# Patient Record
Sex: Female | Born: 1970
Health system: Southern US, Community
[De-identification: ages and names within clinical notes are randomized; demographics above are authoritative.]

## PROBLEM LIST (undated history)

## (undated) DIAGNOSIS — C801 Malignant (primary) neoplasm, unspecified: Secondary | ICD-10-CM

## (undated) DIAGNOSIS — T7840XA Allergy, unspecified, initial encounter: Secondary | ICD-10-CM

## (undated) DIAGNOSIS — D649 Anemia, unspecified: Secondary | ICD-10-CM

## (undated) DIAGNOSIS — R7303 Prediabetes: Secondary | ICD-10-CM

## (undated) DIAGNOSIS — C73 Malignant neoplasm of thyroid gland: Secondary | ICD-10-CM

## (undated) DIAGNOSIS — D259 Leiomyoma of uterus, unspecified: Secondary | ICD-10-CM

## (undated) DIAGNOSIS — I10 Essential (primary) hypertension: Secondary | ICD-10-CM

## (undated) HISTORY — DX: Essential (primary) hypertension: I10

## (undated) HISTORY — DX: Leiomyoma of uterus, unspecified: D25.9

## (undated) HISTORY — DX: Anemia, unspecified: D64.9

## (undated) HISTORY — DX: Allergy, unspecified, initial encounter: T78.40XA

## (undated) HISTORY — PX: TUBAL LIGATION: SHX77

## (undated) HISTORY — DX: Malignant neoplasm of thyroid gland: C73

---
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
## 1998-02-27 ENCOUNTER — Encounter: Admission: RE | Admit: 1998-02-27 | Discharge: 1998-02-27 | Payer: Self-pay | Admitting: Family Medicine

## 1998-04-03 ENCOUNTER — Other Ambulatory Visit: Admission: RE | Admit: 1998-04-03 | Discharge: 1998-04-03 | Payer: Self-pay | Admitting: *Deleted

## 1998-06-16 ENCOUNTER — Emergency Department (HOSPITAL_COMMUNITY): Admission: EM | Admit: 1998-06-16 | Discharge: 1998-06-16 | Payer: Self-pay | Admitting: Emergency Medicine

## 2000-02-20 ENCOUNTER — Encounter: Admission: RE | Admit: 2000-02-20 | Discharge: 2000-02-20 | Payer: Self-pay | Admitting: Family Medicine

## 2000-04-23 ENCOUNTER — Encounter: Admission: RE | Admit: 2000-04-23 | Discharge: 2000-04-23 | Payer: Self-pay | Admitting: Family Medicine

## 2000-06-02 ENCOUNTER — Encounter: Admission: RE | Admit: 2000-06-02 | Discharge: 2000-06-02 | Payer: Self-pay | Admitting: Family Medicine

## 2000-06-23 ENCOUNTER — Other Ambulatory Visit: Admission: RE | Admit: 2000-06-23 | Discharge: 2000-06-23 | Payer: Self-pay | Admitting: Family Medicine

## 2000-06-23 ENCOUNTER — Encounter: Admission: RE | Admit: 2000-06-23 | Discharge: 2000-06-23 | Payer: Self-pay | Admitting: Family Medicine

## 2000-07-16 ENCOUNTER — Encounter: Admission: RE | Admit: 2000-07-16 | Discharge: 2000-07-16 | Payer: Self-pay | Admitting: Family Medicine

## 2001-03-26 ENCOUNTER — Encounter: Admission: RE | Admit: 2001-03-26 | Discharge: 2001-03-26 | Payer: Self-pay | Admitting: Family Medicine

## 2001-05-25 ENCOUNTER — Encounter: Admission: RE | Admit: 2001-05-25 | Discharge: 2001-05-25 | Payer: Self-pay | Admitting: Family Medicine

## 2001-05-29 ENCOUNTER — Encounter: Admission: RE | Admit: 2001-05-29 | Discharge: 2001-05-29 | Payer: Self-pay | Admitting: Family Medicine

## 2001-07-14 ENCOUNTER — Other Ambulatory Visit: Admission: RE | Admit: 2001-07-14 | Discharge: 2001-07-14 | Payer: Self-pay | Admitting: Family Medicine

## 2001-07-14 ENCOUNTER — Encounter: Admission: RE | Admit: 2001-07-14 | Discharge: 2001-07-14 | Payer: Self-pay | Admitting: Family Medicine

## 2001-07-17 ENCOUNTER — Encounter: Admission: RE | Admit: 2001-07-17 | Discharge: 2001-07-17 | Payer: Self-pay | Admitting: Family Medicine

## 2001-08-27 ENCOUNTER — Encounter: Admission: RE | Admit: 2001-08-27 | Discharge: 2001-08-27 | Payer: Self-pay | Admitting: Family Medicine

## 2001-09-04 ENCOUNTER — Encounter: Admission: RE | Admit: 2001-09-04 | Discharge: 2001-09-04 | Payer: Self-pay | Admitting: Family Medicine

## 2001-09-21 ENCOUNTER — Encounter: Admission: RE | Admit: 2001-09-21 | Discharge: 2001-09-21 | Payer: Self-pay | Admitting: Sports Medicine

## 2002-01-12 ENCOUNTER — Encounter: Admission: RE | Admit: 2002-01-12 | Discharge: 2002-01-12 | Payer: Self-pay | Admitting: Family Medicine

## 2002-08-27 ENCOUNTER — Encounter: Admission: RE | Admit: 2002-08-27 | Discharge: 2002-08-27 | Payer: Self-pay | Admitting: Family Medicine

## 2002-08-30 ENCOUNTER — Encounter: Admission: RE | Admit: 2002-08-30 | Discharge: 2002-08-30 | Payer: Self-pay | Admitting: Family Medicine

## 2002-09-23 ENCOUNTER — Emergency Department (HOSPITAL_COMMUNITY): Admission: EM | Admit: 2002-09-23 | Discharge: 2002-09-23 | Payer: Self-pay

## 2002-10-01 ENCOUNTER — Other Ambulatory Visit: Admission: RE | Admit: 2002-10-01 | Discharge: 2002-10-01 | Payer: Self-pay | Admitting: Family Medicine

## 2002-10-01 ENCOUNTER — Encounter: Admission: RE | Admit: 2002-10-01 | Discharge: 2002-10-01 | Payer: Self-pay | Admitting: Sports Medicine

## 2003-07-17 ENCOUNTER — Other Ambulatory Visit: Admission: RE | Admit: 2003-07-17 | Discharge: 2003-07-17 | Payer: Self-pay | Admitting: Obstetrics and Gynecology

## 2003-09-15 ENCOUNTER — Encounter: Admission: RE | Admit: 2003-09-15 | Discharge: 2003-09-15 | Payer: Self-pay | Admitting: Family Medicine

## 2003-09-22 ENCOUNTER — Encounter: Admission: RE | Admit: 2003-09-22 | Discharge: 2003-09-22 | Payer: Self-pay | Admitting: Family Medicine

## 2003-09-29 ENCOUNTER — Ambulatory Visit (HOSPITAL_COMMUNITY): Admission: RE | Admit: 2003-09-29 | Discharge: 2003-09-29 | Payer: Self-pay | Admitting: Obstetrics and Gynecology

## 2004-01-07 ENCOUNTER — Inpatient Hospital Stay (HOSPITAL_COMMUNITY): Admission: AD | Admit: 2004-01-07 | Discharge: 2004-01-07 | Payer: Self-pay | Admitting: Obstetrics & Gynecology

## 2004-02-28 ENCOUNTER — Encounter: Admission: RE | Admit: 2004-02-28 | Discharge: 2004-02-28 | Payer: Self-pay | Admitting: Obstetrics and Gynecology

## 2004-05-04 ENCOUNTER — Inpatient Hospital Stay (HOSPITAL_COMMUNITY): Admission: RE | Admit: 2004-05-04 | Discharge: 2004-05-06 | Payer: Self-pay | Admitting: Obstetrics and Gynecology

## 2004-05-04 ENCOUNTER — Encounter (INDEPENDENT_AMBULATORY_CARE_PROVIDER_SITE_OTHER): Payer: Self-pay | Admitting: *Deleted

## 2004-08-22 ENCOUNTER — Other Ambulatory Visit: Admission: RE | Admit: 2004-08-22 | Discharge: 2004-08-22 | Payer: Self-pay | Admitting: Family Medicine

## 2004-08-22 ENCOUNTER — Ambulatory Visit: Payer: Self-pay | Admitting: Family Medicine

## 2004-09-21 ENCOUNTER — Ambulatory Visit: Payer: Self-pay | Admitting: Family Medicine

## 2004-10-10 ENCOUNTER — Ambulatory Visit: Payer: Self-pay | Admitting: Family Medicine

## 2004-10-17 ENCOUNTER — Ambulatory Visit: Payer: Self-pay | Admitting: Sports Medicine

## 2004-10-19 ENCOUNTER — Ambulatory Visit: Payer: Self-pay | Admitting: Family Medicine

## 2004-12-11 ENCOUNTER — Encounter: Admission: RE | Admit: 2004-12-11 | Discharge: 2004-12-11 | Payer: Self-pay | Admitting: Sports Medicine

## 2004-12-11 ENCOUNTER — Ambulatory Visit: Payer: Self-pay | Admitting: Sports Medicine

## 2005-08-09 ENCOUNTER — Ambulatory Visit: Payer: Self-pay | Admitting: Family Medicine

## 2005-09-20 ENCOUNTER — Ambulatory Visit: Payer: Self-pay | Admitting: Family Medicine

## 2005-09-20 ENCOUNTER — Other Ambulatory Visit: Admission: RE | Admit: 2005-09-20 | Discharge: 2005-09-20 | Payer: Self-pay | Admitting: Family Medicine

## 2005-09-21 ENCOUNTER — Encounter (INDEPENDENT_AMBULATORY_CARE_PROVIDER_SITE_OTHER): Payer: Self-pay | Admitting: Family Medicine

## 2005-09-21 ENCOUNTER — Encounter (INDEPENDENT_AMBULATORY_CARE_PROVIDER_SITE_OTHER): Payer: Self-pay | Admitting: *Deleted

## 2005-09-21 LAB — CONVERTED CEMR LAB: Pap Smear: NORMAL

## 2006-05-15 DIAGNOSIS — D259 Leiomyoma of uterus, unspecified: Secondary | ICD-10-CM

## 2006-05-15 DIAGNOSIS — D509 Iron deficiency anemia, unspecified: Secondary | ICD-10-CM

## 2006-05-16 ENCOUNTER — Encounter (INDEPENDENT_AMBULATORY_CARE_PROVIDER_SITE_OTHER): Payer: Self-pay | Admitting: *Deleted

## 2006-05-29 ENCOUNTER — Encounter (INDEPENDENT_AMBULATORY_CARE_PROVIDER_SITE_OTHER): Payer: Self-pay | Admitting: *Deleted

## 2006-05-29 ENCOUNTER — Inpatient Hospital Stay (HOSPITAL_COMMUNITY): Admission: RE | Admit: 2006-05-29 | Discharge: 2006-06-01 | Payer: Self-pay | Admitting: Obstetrics and Gynecology

## 2006-08-12 ENCOUNTER — Telehealth (INDEPENDENT_AMBULATORY_CARE_PROVIDER_SITE_OTHER): Payer: Self-pay | Admitting: *Deleted

## 2006-08-26 ENCOUNTER — Telehealth: Payer: Self-pay | Admitting: *Deleted

## 2006-08-27 ENCOUNTER — Ambulatory Visit: Payer: Self-pay | Admitting: Family Medicine

## 2006-08-27 ENCOUNTER — Encounter (INDEPENDENT_AMBULATORY_CARE_PROVIDER_SITE_OTHER): Payer: Self-pay | Admitting: *Deleted

## 2006-08-27 LAB — CONVERTED CEMR LAB
Albumin: 4.3 g/dL (ref 3.5–5.2)
Alkaline Phosphatase: 84 units/L (ref 39–117)
Calcium: 9.1 mg/dL (ref 8.4–10.5)
Chloride: 106 meq/L (ref 96–112)
Hemoglobin: 12.2 g/dL (ref 12.0–15.0)
MCHC: 32.1 g/dL (ref 30.0–36.0)
MCV: 87.4 fL (ref 78.0–100.0)
Platelets: 272 10*3/uL (ref 150–400)
Potassium: 4 meq/L (ref 3.5–5.3)
Total Protein: 7.3 g/dL (ref 6.0–8.3)

## 2006-09-03 ENCOUNTER — Telehealth: Payer: Self-pay | Admitting: *Deleted

## 2006-09-04 ENCOUNTER — Ambulatory Visit: Payer: Self-pay | Admitting: Sports Medicine

## 2006-09-17 ENCOUNTER — Ambulatory Visit: Payer: Self-pay | Admitting: Sports Medicine

## 2006-09-24 ENCOUNTER — Encounter: Admission: RE | Admit: 2006-09-24 | Discharge: 2006-09-24 | Payer: Self-pay | Admitting: Family Medicine

## 2006-11-10 ENCOUNTER — Telehealth (INDEPENDENT_AMBULATORY_CARE_PROVIDER_SITE_OTHER): Payer: Self-pay | Admitting: Family Medicine

## 2006-11-12 ENCOUNTER — Telehealth (INDEPENDENT_AMBULATORY_CARE_PROVIDER_SITE_OTHER): Payer: Self-pay | Admitting: Family Medicine

## 2006-11-24 ENCOUNTER — Encounter (INDEPENDENT_AMBULATORY_CARE_PROVIDER_SITE_OTHER): Payer: Self-pay | Admitting: Family Medicine

## 2007-01-07 ENCOUNTER — Telehealth (INDEPENDENT_AMBULATORY_CARE_PROVIDER_SITE_OTHER): Payer: Self-pay | Admitting: *Deleted

## 2007-01-07 ENCOUNTER — Ambulatory Visit: Payer: Self-pay | Admitting: Family Medicine

## 2007-01-07 DIAGNOSIS — I10 Essential (primary) hypertension: Secondary | ICD-10-CM | POA: Insufficient documentation

## 2007-01-07 DIAGNOSIS — L84 Corns and callosities: Secondary | ICD-10-CM

## 2007-01-23 ENCOUNTER — Telehealth (INDEPENDENT_AMBULATORY_CARE_PROVIDER_SITE_OTHER): Payer: Self-pay | Admitting: Family Medicine

## 2007-01-29 ENCOUNTER — Encounter (INDEPENDENT_AMBULATORY_CARE_PROVIDER_SITE_OTHER): Payer: Self-pay | Admitting: Family Medicine

## 2007-01-30 ENCOUNTER — Ambulatory Visit: Payer: Self-pay | Admitting: Family Medicine

## 2007-01-30 ENCOUNTER — Encounter (INDEPENDENT_AMBULATORY_CARE_PROVIDER_SITE_OTHER): Payer: Self-pay | Admitting: Family Medicine

## 2007-01-30 DIAGNOSIS — B351 Tinea unguium: Secondary | ICD-10-CM

## 2007-01-30 LAB — CONVERTED CEMR LAB
BUN: 14 mg/dL (ref 6–23)
Calcium: 9.3 mg/dL (ref 8.4–10.5)
Chloride: 105 meq/L (ref 96–112)
Cholesterol: 123 mg/dL (ref 0–200)
LDL Cholesterol: 70 mg/dL (ref 0–99)
Potassium: 3.9 meq/L (ref 3.5–5.3)
Sodium: 141 meq/L (ref 135–145)
Total CHOL/HDL Ratio: 3.2
Triglycerides: 76 mg/dL (ref <150)

## 2007-05-28 ENCOUNTER — Telehealth (INDEPENDENT_AMBULATORY_CARE_PROVIDER_SITE_OTHER): Payer: Self-pay | Admitting: Family Medicine

## 2008-04-12 ENCOUNTER — Encounter: Payer: Self-pay | Admitting: Family Medicine

## 2008-04-12 ENCOUNTER — Ambulatory Visit: Payer: Self-pay | Admitting: Family Medicine

## 2008-04-12 DIAGNOSIS — L909 Atrophic disorder of skin, unspecified: Secondary | ICD-10-CM | POA: Insufficient documentation

## 2008-04-12 DIAGNOSIS — L919 Hypertrophic disorder of the skin, unspecified: Secondary | ICD-10-CM

## 2008-04-14 ENCOUNTER — Encounter: Payer: Self-pay | Admitting: Family Medicine

## 2008-04-26 ENCOUNTER — Encounter: Payer: Self-pay | Admitting: Sports Medicine

## 2008-05-03 ENCOUNTER — Telehealth: Payer: Self-pay | Admitting: Sports Medicine

## 2008-06-10 ENCOUNTER — Ambulatory Visit: Payer: Self-pay | Admitting: Family Medicine

## 2008-06-10 ENCOUNTER — Encounter: Payer: Self-pay | Admitting: Family Medicine

## 2008-06-10 LAB — CONVERTED CEMR LAB
ALT: 21 units/L (ref 0–35)
Alkaline Phosphatase: 63 units/L (ref 39–117)
Calcium: 9.3 mg/dL (ref 8.4–10.5)
Chloride: 108 meq/L (ref 96–112)
Cholesterol: 113 mg/dL (ref 0–200)
Glucose, Bld: 102 mg/dL — ABNORMAL HIGH (ref 70–99)
HCT: 34.1 % — ABNORMAL LOW (ref 36.0–46.0)
HDL: 43 mg/dL (ref >39)
LDL Cholesterol: 62 mg/dL (ref 0–99)
Potassium: 3.9 meq/L (ref 3.5–5.3)
RBC: 4.08 M/uL (ref 3.87–5.11)
Total Bilirubin: 0.6 mg/dL (ref 0.3–1.2)
Total Protein: 7.7 g/dL (ref 6.0–8.3)
Triglycerides: 38 mg/dL (ref <150)

## 2008-08-01 ENCOUNTER — Telehealth (INDEPENDENT_AMBULATORY_CARE_PROVIDER_SITE_OTHER): Payer: Self-pay | Admitting: *Deleted

## 2008-11-10 ENCOUNTER — Ambulatory Visit: Payer: Self-pay | Admitting: Family Medicine

## 2008-11-10 ENCOUNTER — Encounter: Payer: Self-pay | Admitting: Sports Medicine

## 2008-11-10 LAB — CONVERTED CEMR LAB
Hemoglobin: 11.3 g/dL
TSH: 1.952 u[IU]/mL (ref 0.350–4.500)

## 2009-04-06 ENCOUNTER — Telehealth: Payer: Self-pay | Admitting: *Deleted

## 2009-04-06 ENCOUNTER — Encounter: Payer: Self-pay | Admitting: Family Medicine

## 2009-04-06 ENCOUNTER — Ambulatory Visit: Payer: Self-pay | Admitting: Family Medicine

## 2009-04-06 LAB — CONVERTED CEMR LAB
Bilirubin Urine: NEGATIVE
Glucose, Urine, Semiquant: NEGATIVE
Ketones, urine, test strip: NEGATIVE
Specific Gravity, Urine: 1.02
Urobilinogen, UA: 0.2
pH: 6

## 2009-04-10 ENCOUNTER — Encounter: Payer: Self-pay | Admitting: *Deleted

## 2009-04-10 LAB — CONVERTED CEMR LAB
ALT: 13 units/L (ref 0–35)
BUN: 15 mg/dL (ref 6–23)
Basophils Relative: 1 % (ref 0–1)
CO2: 26 meq/L (ref 19–32)
Calcium: 9.5 mg/dL (ref 8.4–10.5)
Creatinine, Ser: 0.79 mg/dL (ref 0.40–1.20)
Eosinophils Absolute: 0.1 10*3/uL (ref 0.0–0.7)
Glucose, Bld: 97 mg/dL (ref 70–99)
HCT: 35.3 % — ABNORMAL LOW (ref 36.0–46.0)
Hemoglobin: 11 g/dL — ABNORMAL LOW (ref 12.0–15.0)
Lymphs Abs: 2.8 10*3/uL (ref 0.7–4.0)
MCHC: 31.2 g/dL (ref 30.0–36.0)
MCV: 85.3 fL (ref 78.0–100.0)
Potassium: 3.8 meq/L (ref 3.5–5.3)
RBC: 4.14 M/uL (ref 3.87–5.11)
RDW: 15 % (ref 11.5–15.5)

## 2009-06-27 ENCOUNTER — Telehealth: Payer: Self-pay | Admitting: *Deleted

## 2009-07-11 ENCOUNTER — Ambulatory Visit: Payer: Self-pay | Admitting: Family Medicine

## 2009-07-14 ENCOUNTER — Encounter: Admission: RE | Admit: 2009-07-14 | Discharge: 2009-07-14 | Payer: Self-pay | Admitting: Sports Medicine

## 2009-07-14 LAB — CONVERTED CEMR LAB: Pap Smear: NORMAL

## 2010-01-22 ENCOUNTER — Encounter: Payer: Self-pay | Admitting: Sports Medicine

## 2010-01-24 ENCOUNTER — Telehealth: Payer: Self-pay | Admitting: Sports Medicine

## 2010-04-19 NOTE — Miscellaneous (Signed)
  Clinical Lists Changes  Problems: Removed problem of SCREENING FOR MALIGNANT NEOPLASM OF THE CERVIX (ICD-V76.2) Removed problem of MYALGIA (ICD-729.1) Removed problem of OTHER NONSPECIFIC FINDING EXAMINATION OF URINE (ICD-791.9) Removed problem of OTHER GENERAL SYMPTOMS (ICD-780.99) Removed problem of MENORRHAGIA (ICD-626.2)

## 2010-04-19 NOTE — Progress Notes (Signed)
Summary: Shot Req  Phone Note Call from Patient Call back at [phone]   Caller: Patient Summary of Call: Pt requesting shot records faxed to her job at [phone]. Initial call taken by: Clydell Hakim,  June 27, 2009 4:35 PM  Follow-up for Phone Call        ncir is down. will try again tomorrow Follow-up by: Golden Circle RN,  June 27, 2009 4:46 PM  Additional Follow-up for Phone Call Additional follow up Details #1::        shot records printed. she has very few on file.pulled paper file. only one other shot on file.faxed per request Additional Follow-up by: Golden Circle RN,  June 28, 2009 8:33 AM

## 2010-04-19 NOTE — Assessment & Plan Note (Signed)
Summary: myalgia- unclear etiology   Vital Signs:  Patient profile:   40 year old female Height:      62 inches Weight:      169 pounds BMI:     31.02 BSA:     1.78 Temp:     99.5 degrees F Pulse rate:   85 / minute BP sitting:   126 / 88  Vitals Entered By: Jone Baseman CMA (April 06, 2009 10:42 AM) CC: achy all over Is Patient Diabetic? No Pain Assessment Patient in pain? no        Primary Care Provider:  Rodney Langton MD  CC:  achy all over.  History of Present Illness: 40yo F c/o achy pain all over  Myalgias: x 4 days.  Thinks that she may be overworking herself and just be fatigued and experiencing body aches.  Denies any fevers, chills, joint swelling, or redness.  No temperature instability or changes in her appetite.  Started her period on Sunday.  Foul odor urine: No dysuria, inc frequency, or hematuria.  No flank pain.  No hx of recurrent UTIs.    Habits & Providers  Alcohol-Tobacco-Diet     Tobacco Status: never  Allergies: No Known Drug Allergies  Past History:  Past Medical History: Last updated: 05/15/2006 anemia - per pt, treated for syphilis 1988 - treated in [place]  Past Surgical History: Last updated: 05/15/2006 11/94 primary LTCS female 10 lbs - 08/25/2001, TAB 1986, 1992, 1993 (5-6 wks) - 08/25/2001  Review of Systems        Denies any fevers, chills, joint swelling, or redness.  No temperature instability or changes in her appetite. No dysuria, inc frequency, or hematuria.  No flank pain.   Physical Exam  General:  VS Reviewed. Well appearing, NAD.  Eyes:  no injected conjunctiva Mouth:  Oral mucosa and oropharynx without lesions or exudates.  Teeth in good repair. Neck:  supple, full ROM, no goiter or mass  Lungs:  Normal respiratory effort, chest expands symmetrically. Lungs are clear to auscultation, no crackles or wheezes. Heart:  Normal rate and regular rhythm. S1 and S2 normal without gallop, murmur, click, rub or  other extra sounds. Abdomen:  Soft, NT, ND, no HSM, active BS  Msk:  no focal deformities, erythema, or edema moves all ext no deficits Extremities:  no edema Neurologic:  no focal deficits Skin:  nl color and turgor   Impression & Recommendations:  Problem # 1:  MYALGIA (ICD-729.1) Assessment New Unclear of etiology.  Does not appear to be infectious.  Will check TSH, CBC (r/o anemia), and CMET to evaluate electrolytes.  Advised at this time to alternate with tylenol and motrin.  I am suspicious for possible secondary gain from the sick role....she is insisting on being out of work for the next 2 days.  Will all her with the lab results at [phone] (cell). She is to f/u with Dr. Karie Schwalbe her pcp in 4 weeks unless symptoms worsen than she is to be seen sooner.   Orders: Comp Met-[facility] [id number]) CBC w/Diff-[facility] ([id number]) TSH-[facility] [phone]) [facility]- Est  Level 4 (84696)  Problem # 2:  OTHER NONSPECIFIC FINDING EXAMINATION OF URINE (ICD-791.9) Assessment: New Pt reports foul odor urine and wants to be tested.  I reassured her that it is not likely a UTI given her lack of symptoms.  UA conveyed no signs of infection but small blood c/w her current menstrual period.     Orders: Urinalysis-[facility] (00000) [facility]- Est  Level  4 (99214)  Complete Medication List: 1)  Ferrous Sulfate 325 (65 Fe) Mg Tabs (Ferrous sulfate) .... Take 1 tablet by mouth twice a day 2)  Hydrochlorothiazide 12.5 Mg Tabs (Hydrochlorothiazide) .... One tab by mouth daily  Patient Instructions: 1)  Follow up with Dr. Karie Schwalbe next month. 2)  Today we checked your thyroid, hgb, and electrolytes. 3)  I want you to alternate b/w tylenol 650mg  every 6 hours and ibuprofen 600mg  every 6 hours.    Laboratory Results   Urine Tests  Date/Time Received: April 06, 2009 11:21 AM  Date/Time Reported: April 06, 2009 12:09 PM  April 06, 2009 11:22 AM   Routine Urinalysis   Color: yellow Appearance: Hazy Glucose: negative    (Normal Range: Negative) Bilirubin: negative   (Normal Range: Negative) Ketone: negative   (Normal Range: Negative) Spec. Gravity: 1.020   (Normal Range: 1.003-1.035) Blood: large   (Normal Range: Negative) pH: 6.0   (Normal Range: 5.0-8.0) Protein: trace   (Normal Range: Negative) Urobilinogen: 0.2   (Normal Range: 0-1) Nitrite: negative   (Normal Range: Negative) Leukocyte Esterace: negative   (Normal Range: Negative)  Urine Microscopic WBC/HPF: Rare RBC/HPF: 5-10 Bacteria/HPF: 2+ cocci Mucous/HPF: 1+ Epithelial/HPF: 5-10 Other: clue cells present moderate     Comments:  ...........test performed by..........Marland Kitchen San Morelle, SMA

## 2010-04-19 NOTE — Progress Notes (Signed)
 Summary: cxl appt  Phone Note Call from Patient   Caller: Patient Summary of Call: pt cxl appt for today because of no baby sitter, did not resch. Initial call taken by: KARNA SEMINOLE,  May 03, 2008 9:54 AM  Follow-up for Phone Call        Have her reschedule at some point, my next opening if possible.  -Dr. ONEIDA. Follow-up by: DEBBY PETTIES MD,  May 03, 2008 12:37 PM      Appended Document: cxl appt pt advised. she will make an appt.

## 2010-04-19 NOTE — Progress Notes (Signed)
Summary: triage  Phone Note Call from Patient Call back at Home Phone [phone]   Caller: Patient Summary of Call: Not feeling well and has been unable to work wondering if she can be seen today. Initial call taken by: Clydell Hakim,  April 06, 2009 9:05 AM  Follow-up for Phone Call        body aches x 1 day. taking motrin. pain is 5/10. needs note for work since she is not going to day work in at 11. states she will call back if unable to come.  4 dnkas since 5/10. to Dennison Nancy, RN Follow-up by: Golden Circle RN,  April 06, 2009 9:15 AM  Additional Follow-up for Phone Call Additional follow up Details #1::        Probation letter sent. Additional Follow-up by: Dennison Nancy RN,  April 10, 2009 11:53 AM

## 2010-04-19 NOTE — Assessment & Plan Note (Signed)
Summary: CPE/KH   Vital Signs:  Patient profile:   40 year old female Height:      62 inches Weight:      170 pounds BMI:     31.21 Temp:     98.6 degrees F oral Pulse rate:   85 / minute BP sitting:   125 / 84  (left arm) Cuff size:   regular  Vitals Entered By: Tessie Fass CMA (July 11, 2009 10:05 AM) CC: complete physical with pap Is Patient Diabetic? No Pain Assessment Patient in pain? no        Primary Care Provider:  Rodney Langton MD  CC:  complete physical with pap.  History of Present Illness: Scheduled for CPE because she needed a form completed for school.  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    She denies any chronic medical problems other than anemia for which she remains on iron.  Her menses are heavy.  BTL for contraception.    Lipids at goal, BP at goal.  Will schedule first mammogram.  Habits & Providers  Alcohol-Tobacco-Diet     Tobacco Status: never  Current Medications (verified): 1)  Ferrous Sulfate 325 (65 Fe) Mg Tabs (Ferrous Sulfate) .... Take 1 Tablet By Mouth Twice A Day 2)  Hydrochlorothiazide 12.5 Mg Tabs (Hydrochlorothiazide) .... One Tab By Mouth Daily  Allergies: No Known Drug Allergies  Social History: from [place]; nonsmoker, occasional EtOH (1-2 wine coolers); lives with 9yo son.  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 Mother recently moved into their home from [place] due to dementia and TIAs.   Review of Systems  The patient denies anorexia, fever, hoarseness, syncope, peripheral edema, prolonged cough, headaches, abdominal pain, melena, and severe indigestion/heartburn.    Physical Exam  General:  alert and well-developed.   Eyes:  pupils equal, pupils round, and pupils reactive to light.   Ears:  R ear normal and L ear normal.   Nose:  no external deformity and no nasal discharge.   Mouth:  pharynx pink and moist and fair dentition.     Neck:  supple and no masses.   Breasts:  No mass, nodules, thickening, tenderness, bulging, retraction, inflamation, nipple discharge or skin changes noted.   Lungs:  normal respiratory effort and normal breath sounds.   Heart:  normal rate and regular rhythm.   Abdomen:  soft, non-tender, normal bowel sounds, and no masses.   Genitalia:  normal introitus, no external lesions, no vaginal discharge, mucosa pink and moist, no vaginal or cervical lesions, no vaginal atrophy, no friaility or hemorrhage, normal uterus size and position, and no adnexal masses or tenderness.   Msk:  normal ROM.   Extremities:  No clubbing, cyanosis, edema, or deformity noted with normal full range of motion of all joints.   Skin:  turgor normal, color normal, and no suspicious lesions.   Cervical Nodes:  No lymphadenopathy noted Axillary Nodes:  No palpable lymphadenopathy Psych:  Cognition and judgment appear intact. Alert and cooperative with normal attention span and concentration. No apparent delusions, illusions, hallucinations   Impression & Recommendations:  Problem # 1:  HEALTH MAINTENANCE EXAM (ICD-V70.0) Form completed for school Orders: [facility] - Est  40-64 yrs [phone])  Problem # 2:  SCREENING FOR MALIGNANT NEOPLASM OF THE CERVIX (ICD-V76.2)  Orders: Pap Smear-[facility] ([id number]) [facility] - Est  40-64 yrs (95621)  Complete Medication List: 1)  Ferrous Sulfate 325 (  65 Fe) Mg Tabs (Ferrous sulfate) .... Take 1 tablet by mouth twice a day 2)  Hydrochlorothiazide 12.5 Mg Tabs (Hydrochlorothiazide) .... One tab by mouth daily  Patient Instructions: 1)  Please schedule a follow-up appointment as needed .

## 2010-04-19 NOTE — Letter (Signed)
Summary: Probation Letter  [facility]  [address]   [place], [place] [address]   Phone: [phone]  Fax: [phone]    04/10/2009  CATHREN SWEEN [address] [place], [place]  [address]  Dear Ms. Berrocal,  With the goal of better serving all our patients the [facility] is following each patient's missed appointments.  You have missed at least 3 appointments with our practice.If you cannot keep your appointment, we expect you to call at least 24 hours before your appointment time.  Missing appointments prevents other patients from seeing [place] and makes it difficult to provide you with the best possible medical care.      1.   If you miss one more appointment, we will only give you limited medical services. This means we will not call in medication refills, complete a form, or make a referral for you except when you are here for a scheduled office visit.    2.   If you miss 2 or more appointments in the next year, we will dismiss you from our practice.    Our office staff can be reached at [phone] Monday through Friday from 8:30 a.m.-5:00 p.m. and will be glad to schedule your appointment as necessary.    Thank you.   [facility]  Appended Document: Probation Letter cert.mailed

## 2010-04-19 NOTE — Progress Notes (Signed)
Summary: refill  Phone Note Refill Request Call back at [phone] Message from:  Patient  Refills Requested: Medication #1:  HYDROCHLOROTHIAZIDE 12.5 MG TABS One tab by mouth daily. [facility] [address]  Initial call taken by: De Nurse,  January 24, 2010 9:26 AM    Prescriptions: HYDROCHLOROTHIAZIDE 12.5 MG TABS (HYDROCHLOROTHIAZIDE) One tab by mouth daily  #30 x 6   Entered and Authorized by:   Rodney Langton MD   Signed by:   Rodney Langton MD on 01/24/2010   Method used:   Electronically to        [facility] [phone]* (retail)       [address]       [place], [place]  [address]       Ph: [phone]       Fax: [phone]   RxID:   [id number]

## 2010-04-19 NOTE — Assessment & Plan Note (Signed)
 Summary: f/u,df   Vital Signs:  Patient profile:   40 year old female Height:      62 inches Weight:      162 pounds BMI:     29.74 BSA:     1.75 Temp:     98.7 degrees F Pulse rate:   88 / minute BP sitting:   111 / 81  Vitals Entered By: Harlene Carte CMA (November 10, 2008 10:43 AM) CC: meet new MD Is Patient Diabetic? No Pain Assessment Patient in pain? no        Primary Care Provider:  Debby Petties MD  CC:  meet new MD.  History of Present Illness: 60F with HTN, anemia here for fu and meet new MD and with c/o cold intolerance.  HTN:  Taking/tolerating HCTZ.  Noticed at home diastolic occasionally in the 90's but otherwise asymptomatic. BP well controlled.  Anemia:  Asymptomatic, no dizziness, lightheadedness, SOB, palpitations.  Anemia is 2/2 menorrhagia from uterine fibroids.  Has not been taking iron.  Cannot give a reason.  Cold intolerance:  Non-specific, no fevers/chills, no fatigue, no hx hypothyroidism however she did endorse that her neck swelled up at some point and then went back down.  No constipation.  Thinning of lateral eyebrows however patient states they have been like this for a long time.  Habits & Providers  Alcohol-Tobacco-Diet     Tobacco Status: never  Allergies: No Known Drug Allergies  Past History:  Past Medical History: Last updated: 05/15/2006 anemia - per pt, treated for syphilis 1988 - treated in [place]  Past Surgical History: Last updated: 05/15/2006 11/94 primary LTCS female 10 lbs - 08/25/2001, TAB 1986, 1992, 1993 (5-6 wks) - 08/25/2001  Family History: Last updated: 05/15/2006 Dad - good health, Mom - DM type 2, HTN, PGGM - lived to 65 yo  No CAD, no MI, PGM - cancer, unknown type  Social History: Last updated: 09/04/2006 from [place]; nonsmoker, occasional EtOH (1-2 wine coolers); lives with 9yo son.  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 Delivered  daughter 3 mos. ago. Mother recently moved into their home from [place] due to dementia and TIAs.   Review of Systems       See HPI  Physical Exam  General:  Well-developed,well-nourished,in no acute distress; alert,appropriate and cooperative throughout examination Head:  Eyebrows thinning on lateral half. Eyes:  No corneal or conjunctival inflammation noted. EOMI. Perrla.  Ears:  External ear exam shows no significant lesions or deformities.   Nose:  External nasal examination shows no deformity or inflammation.  Mouth:  Oral mucosa and oropharynx without lesions or exudates.  Teeth in good repair. Neck:  slight fullness to lower anterior neck, no palpable nodules of masses. Lungs:  Normal respiratory effort, chest expands symmetrically. Lungs are clear to auscultation, no crackles or wheezes. Heart:  Normal rate and regular rhythm. S1 and S2 normal without gallop, murmur, click, rub or other extra sounds. Abdomen:  Bowel sounds positive,abdomen soft and non-tender without masses, organomegaly or hernias noted. Extremities:  No clubbing, cyanosis, edema, or deformity noted with normal full range of motion of all joints.     Impression & Recommendations:  Problem # 1:  OTHER GENERAL SYMPTOMS (ICD-780.99) Assessment New Cold intolerance, with neck fullness, and hx neck swelling.  Will check TSH.  Likely low yield but need to check.  Orders: TSH-[facility] ([id number]) [facility]- Est  Level 4 (00785)  Problem # 2:  HYPERTENSION, BENIGN ESSENTIAL (ICD-401.1) Assessment: Improved Well controlled, no changes.  Her updated medication list for this problem includes:    Hydrochlorothiazide  12.5 Mg Tabs (Hydrochlorothiazide ) ..... One tab by mouth daily  Orders: [facility]- Est  Level 4 (00785)  Problem # 3:  ANEMIA, IRON DEFICIENCY, UNSPEC. (ICD-280.9) Assessment: Deteriorated Last Hb slightly low.  Pt has not been taking Iron.  Discussed with her and she agrees to take it.  Hb ok today,  11.3.  Her updated medication list for this problem includes:    Ferrous Sulfate 325 (65 Fe) Mg Tabs (Ferrous sulfate) .SABRA... Take 1 tablet by mouth twice a day  Orders: Hemoglobin-[facility] (14981) [facility]- Est  Level 4 (00785)  Complete Medication List: 1)  Ferrous Sulfate 325 (65 Fe) Mg Tabs (Ferrous sulfate) .... Take 1 tablet by mouth twice a day 2)  Hydrochlorothiazide  12.5 Mg Tabs (Hydrochlorothiazide ) .... One tab by mouth daily  Patient Instructions: 1)  Great to meet you today, 2)  We will check your TSH and hemoglobin. 3)  I will refill your HCTZ, remember to take your iron because you are still anemic. 4)  Come back to see me in a year or sooner if you have any problems. 5)  -Dr. ONEIDA. Prescriptions: HYDROCHLOROTHIAZIDE  12.5 MG TABS (HYDROCHLOROTHIAZIDE ) One tab by mouth daily  #90 x 6   Entered and Authorized by:   Debby Petties MD   Signed by:   Debby Petties MD on 11/10/2008   Method used:   Electronically to        [facility] [phone]* (retail)       [address]       [place], [place]  [address]       Ph: [phone]       Fax: [phone]   RxID:   [id number]   Last PAP:  NEGATIVE FOR INTRAEPITHELIAL LESIONS OR MALIGNANCY. (04/12/2008 12:00:00 AM) PAP Next Due:  1 yr Last Creatinine:  0.82 (06/10/2008 8:48:00 PM) Creatinine Next Due: 1 yr Last Potassium:  3.9 (06/10/2008 8:48:00 PM) Potassium Next Due:  1 yr    Laboratory Results   Blood Tests   Date/Time Received: November 10, 2008 11:05 AM  Date/Time Reported: November 10, 2008 11:17 AM     CBC   HGB:  11.3 g/dL   (Normal Range: 86.9-82.9 in Males, 12.0-15.0 in Females) Comments: ...........test performed by...........SABRAArland Morel, CMA

## 2010-05-16 ENCOUNTER — Encounter: Payer: Self-pay | Admitting: *Deleted

## 2010-08-03 NOTE — Op Note (Signed)
[name], [name]               ACCOUNT NO.:  [id number]   MEDICAL RECORD NO.:  [id number]          PATIENT TYPE:  INP   LOCATION:  NA                            FACILITY:  [facility]   PHYSICIAN:  Malachi Pro. Ambrose Mantle, M.D. DATE OF BIRTH:  10-10-1970   DATE OF PROCEDURE:  05/29/2006  DATE OF DISCHARGE:                               OPERATIVE REPORT   PREOPERATIVE DIAGNOSES:  1. Intrauterine pregnancy 39 weeks.  2. Prior C-section x2.  3. Voluntary sterilization.  4. Keloid scar on the old cesarean section scar, removed.  5. History of syphilis.   POSTOPERATIVE DIAGNOSES:  1. Intrauterine pregnancy 39 weeks.  2. Prior C-section x2.  3. Voluntary sterilization.  4. Keloid scar on the old cesarean section scar, removed.  5. History of syphilis.   OPERATION:  Low-transverse cervical cesarean section, removal of old  scar, bilateral tubal ligation.   OPERATOR:  Henley.   ASSISTANT:  [place].   Spinal anesthesia.   The patient was brought to the operating room and was given a spinal  anesthetic by Dr. Arby Barrette.  It took several minutes to get a spinal  anesthetic because the patient's back was not conducive to spinal  anesthesia.  After the anesthetic was obtained, the patient was placed  in the left lateral tilt position in a frog-leg position.  The abdomen  was prepped with Betadine solution.  The urethra was prepped, and a  Foley catheter was inserted to straight drain.  The patient was then  placed supine.  The abdomen was draped as a sterile field.  The old  keloid scar was excised, and the remainder of the subcu tissue was  opened.  The fascia was incised transversely, separated from the rectus  muscles superiorly and inferiorly.  There was a little bit of scarring  that was making the fascia not open quite as readily as you would like.  The peritoneum was opened during the separation of the rectus muscle  from the fascia superiorly.  The abdominal wall was then continued to  be  opened in a vertical fashion sparing the bladder.  The incision was then  stretched.  A bladder blade was inserted.  An incision was made in the  lower uterine segment, hopefully high enough that I would no trouble  delivering the head through the incision.  A large amount of clear fluid  was obtained as I entered the amniotic sac with my finger.  I then  enlarged the incision transversely with the bandage scissors, delivered  the head through the incisional opening with fundal pressure by Dr.  Jackelyn Knife, suctioned the nose and mouth, and then delivered the rest of  the baby.  The cord was clamped.  The infant was given to the  neonatologist who was in attendance.  It was a 7 pounds 15 ounces female  with Apgars of 8 and 9 at one and five minutes.  A small segment of cord  was obtained in case a pH was necessary, but when the Apgars were  normal, it was discarded.  The patient was for cord blood collection,  so  we tried to be as gentle as possible with the placenta to preserve as  much blood as possible.  After the placenta was removed, the inside of  the uterus was inspected, found to be free of any debris.  The cervix  was not dilated.  The uterus was then closed in two layers using a  running locked suture of 0 Vicryl on the first layer, nonlocking suture  of the same material on the second layer.  A couple spots were made  hemostatic with 3-0 Vicryl.  The uterus appeared normal then.  I did not  feel any definite fibroids.  There was a suggestion of a very small  fibroid on the uterine surface, but none that could be documented for  sure.  Both tubes and ovaries appeared normal.  The mid portion of each  mesosalpinx was utilized to create a window in the mesosalpinx, and then  two ties of 0 plain catgut were placed proximally and distally on each  tube.  A segment of tube intervening was removed.  Hemostasis was  adequate.  The right tubal fimbria actually bled some, and I made  this  hemostatic with the Bovie.  At this point, hemostasis was complete.  Liberal irrigation confirmed it.  The rectus muscle and peritoneum were  closed in one layer using interrupted sutures of 0 Vicryl.  The fascia  was closed with two running sutures of 0 Vicryl, the subcu with a  running 3-0 Vicryl, and because the patient had had a problem with a  keloid formation, I wanted to do as much as I could to avoid another  keloid, so I closed her skin with a running subcuticular suture of 3-0  Vicryl that was not dyed.  The patient seemed to tolerate the procedure  well.  Blood loss was estimated at 1000 mL.  Sponge and needle counts  were correct, and she was returned to recovery in satisfactory  condition.      Malachi Pro. Ambrose Mantle, M.D.  Electronically Signed     TFH/MEDQ  D:  05/29/2006  T:  05/29/2006  Job:  [id number]

## 2010-08-03 NOTE — Discharge Summary (Signed)
[name], [name]               ACCOUNT NO.:  [id number]   MEDICAL RECORD NO.:  [id number]          PATIENT TYPE:  INP   LOCATION:  9128                          [facility]ILITY:  [facility]   PHYSICIAN:  Sherron Monday, MD        DATE OF BIRTH:  10-16-1970   DATE OF ADMISSION:  05/29/2006  DATE OF DISCHARGE:                               DISCHARGE SUMMARY   ADMISSION DIAGNOSIS:  Intrauterine pregnancy at term, history of  cesarean section x 2, undesired fertility.   DISCHARGE DIAGNOSIS:  Intrauterine pregnancy at term, history of  cesarean section x 2, undesired fertility, status post repeat low  transverse cesarean section, bilateral tubal ligation.   HISTORY OF PRESENT ILLNESS:  A 40 year old, African-American female G6  P2-0-3-2 who is admitted for repeat low transverse cesarean section and  tubal ligation.  LMP was September 02, 2005 with an [facility] of June 05, 2006 by  ultrasound.  She had an ultrasound performed November 08, 2005 which  confirmed the [facility].   PRENATAL LABORATORY DATA:  She is A positive, antibody screen negative.  Sickle cell negative, RPR nonreactive, rubella immune, hepatitis B  surface antigen negative. Gonorrhea negative.  Chlamydia negative. TSH  is within normal limits. Group B streptococcus was positive.  One hour  Glucola was 117 in October and 128 in January. She had an amniocentesis  at 18 weeks which showed 68 XX chromosome.  She had relatively  uncomplicated prenatal course and desires cesarean section,  understanding the risks, benefits and alternatives as she has had two  previously.   PAST MEDICAL HISTORY:  1. Anemia.  2. Gestational diabetes with a previous pregnancy.   PAST SURGICAL HISTORY:  Significant for cesarean sections in 1994 and  2006, as well as three terminations.   PAST OBSTETRICAL HISTORY:  She is a G6 P2-0-3-2 with three terminations  and two cesarean sections.  She has a history of syphilis having been  treated twice.   ALLERGIES:  No known  drug allergies.   SOCIAL HISTORY:  She denies alcohol, tobacco or drug use.   PHYSICAL EXAMINATION:  On admission she is afebrile with vital signs  stable and benign examination.   HOSPITAL COURSE:  She is admitted for the C section, which she underwent  without complications.  EBL was approximately 1000 cc. Her previous  incision was excised and placenta and tubal segments as well as the old  scar was sent to pathology.  Her postoperative and postpartum course was  relatively uncomplicated. She remained afebrile and vital signs were  stable throughout. Her pain was well controlled.  Her hemoglobin  decreased from 10.9 peripartum to 9.9 postpartum. She is discharged to  home on postoperative day three with routine discharge instructions and  numbers to call for any questions or problems.  Her incision had been  closed with a subcuticular stitch which looked well at this time. She is  given the numbers to call if she has any questions or problems with  redness or discharge at this area.  She is give prescriptions for  Motrin, Vicodin and prenatal vitamins.  She is to follow up in two weeks  for an incision check.   DISCHARGE INFORMATION:  She is A positive, rubella immune. Her  hemoglobin decreased from 10.9 to 9.9. She will breast feed and she got  her tubes ties for contraception.  She was in understanding to all this  and she will follow up.      Sherron Monday, MD  Electronically Signed     JB/MEDQ  D:  06/01/2006  T:  06/02/2006  Job:  [id number]

## 2010-08-03 NOTE — H&P (Signed)
[name], [name]               ACCOUNT NO.:  [id number]   MEDICAL RECORD NO.:  [id number]          PATIENT TYPE:  INP   LOCATION:  NA                            [facility]   PHYSICIAN:  Malachi Pro. Ambrose Mantle, M.D. DATE OF BIRTH:  Sep 04, 1970   DATE OF ADMISSION:  05/04/2004  DATE OF DISCHARGE:                                HISTORY & PHYSICAL   PRESENT ILLNESS:  This is a 40 year old black female para 1-0-3-1 gravida 5  with [facility] May 07, 2004 by 9-week ultrasound on September 29, 2003.  Blood  group and type A positive, negative antibody, sickle cell negative, RPR  nonreactive, rubella immune, hepatitis B surface antigen negative, HIV  negative, GC and Chlamydia negative.  Amnio on November 29, 2003 within  normal limits 42 XY.  This patient did have a negative RPR at her first  visit but she had a history of having RPR of 1:4 during her pregnancy in  1994.  She gave a history of having been treated in [place] 7 years earlier  but she was retreated in 2001 at the [facility].  This  patient as stated underwent an ultrasound on November 30, 2003 that her  placed her due date as May 07, 2004.  She underwent amniocentesis that  showed normal female chromosomes.  At approximately 31 weeks pregnancy she  transferred her care to our office.  She underwent fasting glucose which was  elevated at 204 and a 3-hour GTT was 103, 162, 191 and 151.  She went to the  nutrition and diabetic center and has managed her diabetes subsequently with  diet.  Her blood sugars have basically been normal.  I communicated with the  health department on at least two occasions during the pregnancy on March 29, 2004 the health department wanted the father of the baby tested, they  would get in touch with the father of the baby, on April 03, 2004 it was  confirmed that they had not gotten in touch with the baby's father, on  April 09, 2004 the father of the baby had not had his serology  done, her  blood sugars remain normal, as of April 23, 2004 the father of the baby  had not been tested and the patient had initially planned to do a vaginal  birth after cesarean in spite of a single layer closure but since she got  almost to term she has elected now to proceed with cesarean section.   PAST MEDICAL HISTORY:  1.  No known allergies.  2.  Operations:  Cesarean section in 1994.  3.  Illnesses:  History of syphilis treated in 1987, again in 2001, and      apparently the patient has serofast 1:4 titer but thus far the father of      the baby has not been tested.   FAMILY HISTORY:  Mother has noninsulin-dependent diabetes and hypertension.  Father has heart disease and thrombophlebitis.   OBSTETRIC HISTORY:  The patient has apparently had three terminations.  In  November 1994 she had a low transverse cervical C-section  for failure to  progress and a macrosomic infant of 9 pounds 14 ounces.   PHYSICAL EXAMINATION:  GENERAL:  Well-developed somewhat obese black female  in no distress.  VITAL SIGNS:  Weight 193 on May 01, 2004, blood pressure 116/72, pulse  80.  HEAD/EYES/EARS/NOSE AND THROAT:  Normal.  HEART:  Normal sinus rhythm.  No murmurs.  LUNGS:  Clear to auscultation.  BREASTS:  No masses.  ABDOMEN:  Fundal height 39 cm, fetal heart tones normal.  CERVIX:  Tight fingertip, 70%, vertex, at -2.   ADMITTING IMPRESSION:  1.  Intrauterine pregnancy at 39 plus weeks.  2.  Prior cesarean section for macrosomia.  3.  History of syphilis with a serofast titer of 1:4.   PLAN:  Patient is admitted now for cesarean section.  She understands the  risks involved and is ready to proceed.  She declined vaginal birth after  cesarean.      TFH/MEDQ  D:  05/03/2004  T:  05/03/2004  Job:  [id number]

## 2010-08-03 NOTE — Discharge Summary (Signed)
[name], [name]               ACCOUNT NO.:  [id number]   MEDICAL RECORD NO.:  [id number]          PATIENT TYPE:  INP   LOCATION:  9121                          [facility]   PHYSICIAN:  Huel Cote, M.D. DATE OF BIRTH:  05-May-1970   DATE OF ADMISSION:  05/04/2004  DATE OF DISCHARGE:  05/06/2004                                 DISCHARGE SUMMARY   DISCHARGE DIAGNOSES:  1.  Term pregnancy at 39+ weeks delivered.  2.  Repeat low transverse cesarean section.  3.  Positive RPR and a history of syphilis treated x2.  4.  Advanced maternal age with normal amniocentesis, 46XY.   DISCHARGE MEDICATIONS:  1.  Motrin 600 milligrams p.o. every 6 hours.  2.  Percocet 1-2 tablets p.o. every 4 hours p.r.n.   HOSPITAL COURSE:  The patient is a 40 year old gravida 5, para 1-0-3-1, who  was admitted for a scheduled cesarean section given a history of low  transverse cesarean section and declining VBAC status. Her prenatal care was  complicated by a positive RPR and positive FTA antibodies screen. The  patient gave the following history. She had first been diagnosed with  syphilis in 1987 and was treated appropriately at that time. However, later  was found having a positive RPR titer which was zero fast and in 1994 had a  pregnancy and delivery in [facility] with RPR titer at that time of  one in four. That baby tested negative upon delivery and she received no  further treatment at that time. In 2001 the patient was retreated at the  [facility] due to her persistent titer and received of  Bicillin x3, in November 2001. During this pregnancy, the patient had an  early serology done at another office in June of 2005 which was stated to be  nonreactive however all subsequent tests in our office have had a persistent  titer that has been stable or slightly decreasing throughout the pregnancy  of 1 to 2 or less. The patient denied any re-exposure and was counseled  to  have her current husband tested to prove that his serology was also  negative. However, had never followed up with this. On speaking with the  patient now it is apparent that she has never informed her husband of her  past history and is a quite tearful at the prospect of discussing this with  him. She also had advanced maternal age and had an amniocentesis which was  normal. She had gestational diabetes and has been managed with diet  successfully.   PAST MEDICAL HISTORY:  Significant for the syphilis as stated. No other  medical problems.   PAST SURGICAL HISTORY:  Her cesarean section in 1994 only.   OBSTETRICAL HISTORY:  She had a C-section for failure to progress. The  infant was 9 pounds 14 ounces and she has had three abortions, otherwise.   HOSPITAL COURSE:  On admission the patient was afebrile with stable vital  signs and underwent a repeat low transverse cesarean section without  difficulty. She was delivered of a vigorous female infant, 7 pounds  12 ounces  from the vertex presentation and was then admitted for routine postpartum  care. She did very well. Postoperative day #2 she was tolerating regular  diet, was able to void without difficulty and her incision was clear was  Steri-Strips placed. She was therefore felt stable for discharge home.  Unfortunately, after delivery the baby serology was positive as far as RPR  and treponemal test goes and it was felt in the best interest of the baby  that the baby receive antibiotics by the pediatric team. For this reason the  baby necessitated transfer to [facility] as there were no available beds in  the NICU at [facility] and will be transferred to [facility] later today to  receive course of IV penicillin for possible syphilis exposure. The patient  will be discharged and will go with the baby as an outpatient and stay in  the room with the baby there.      KR/MEDQ  D:  05/06/2004  T:  05/07/2004  Job:  [id number]

## 2010-08-03 NOTE — H&P (Signed)
NAMESHAMRA, BRADEEN               ACCOUNT NO.:  [id number]   MEDICAL RECORD NO.:  [id number]          PATIENT TYPE:  INP   LOCATION:  NA                            [facility]   PHYSICIAN:  Malachi Pro. Ambrose Mantle, M.D. DATE OF BIRTH:  January 09, 1971   DATE OF ADMISSION:  05/29/2006  DATE OF DISCHARGE:                              HISTORY & PHYSICAL   HISTORY OF THE PRESENT ILLNESS:  This is a 40 year old black female para  2, 0, 3, 2, gravida 6 admitted for repeat cesarean section and tubal  ligation.  Her last menstrual period was September 02, 2005 with an estimated  date of confinement of June 05, 2006 by ultrasound; date of the  ultrasound was November 08, 2005.  Vaginal ultrasound on November 08, 2005  showed crown-rump length 3.19 cm, 10 weeks 1 day, EDC of June 05, 2006.  The patient requested an amniocentesis.  She was also scheduled for  early glucose screening because she was positive for gestational  diabetes mellitus during her last pregnancy.  Blood group and type; A  positive, negative antibody.  Sickle cell negative.  RPR nonreactive.  Rubella immune.  Hepatitis B surface antigen negative.  HIV negative.  GC and Chlamydia negative.  TSH was normal.  Group B Strep was positive.  One-hour Glucola screening on two occasions was 117 on January 02, 2006  and 128 on March 19, 2006.  The patient did undergo an amniocentesis  at approximately 18 weeks and it showed 46xx chromosomes.   The patient had a relatively benign prenatal course.  She desires repeat  cesarean section and tubal ligation.  As stated earlier both of her  screening glucose tests were completely normal.  She declines a VBAC  having had previous cesarean section times two and is admitted now for  the repeat cesarean section.   ALLERGIES:  No known drug allergies.   PAST SURGICAL HISTORY:  Cesarean sections in 1994 and in 2006.  She  apparently has had three terminations pregnancies.   PAST MEDICAL HISTORY:  The  patient has had iron-deficiency anemia in the  past.  She also had gestational diabetes mellitus during her last  pregnancy.   HABITS:  Alcohol, tobacco and drugs; none.   FAMILY HISTORY:  Father had a heart transplant and also has had varicose  veins.  The patient's mother has high blood pressure and diabetes.   PAST OBSTETRICAL HISTORY:  The patient has had two previous cesarean  sections.  She also reportedly has had three terminations.   PHYSICAL EXAMINATION:  GENERAL APPEARANCE:  This is a well-developed  black female in no distress.  Weight is 195.5 pounds.  HEENT:  The head, eyes, ears, nose and throat show no cranial  abnormalities.  Extraocular movements are intact.  Nose and pharynx are  clear.  NECK:  The neck is supple without thyromegaly.  HEART:  The heart is of normal size and has normal sounds.  LUNGS:  The lungs are clear to auscultation.  ABDOMEN:  The abdomen is soft and nontender.  Fundal height is 39 cm.  Fetal  heart tones are normal.  VAGINAL EXAMINATION:  Cervix is closed.  Presenting part is high, but it  does feel like it is vertex.  SKIN:  There is a significant keloid on the patient's lower transverse  incision.   ADMITTING IMPRESSION:  1. Intrauterine pregnancy at 39 weeks.  2. Prior history of gestational diabetes mellitus; none during this      pregnancy.  3. Voluntary sterilization.  4. History of syphilis treated times two or three.   PLAN:  The patient is admitted for repeat cesarean section and tubal  ligation.  She understands there are risks involved and they have been  explained to her.      Malachi Pro. Ambrose Mantle, M.D.  Electronically Signed     TFH/MEDQ  D:  05/28/2006  T:  05/29/2006  Job:  [id number]

## 2010-08-03 NOTE — Op Note (Signed)
[name], [name]               ACCOUNT NO.:  [id number]   MEDICAL RECORD NO.:  [id number]          PATIENT TYPE:  INP   LOCATION:  9121                          [facility]   PHYSICIAN:  Malachi Pro. Ambrose Mantle, M.D. DATE OF BIRTH:  1970-12-30   DATE OF PROCEDURE:  05/04/2004  DATE OF DISCHARGE:                                 OPERATIVE REPORT   PREOPERATIVE DIAGNOSES:  1.  Intrauterine pregnancy, 39+ weeks.  2.  Prior cesarean section, declines vaginal birth after cesarean delivery.  3.  Positive RPR.   POSTOPERATIVE DIAGNOSES:  1.  Intrauterine pregnancy, 39+ weeks.  2.  Prior cesarean section, declines vaginal birth after cesarean delivery.  3.  Positive RPR.   OPERATION:  Low transverse cervical cesarean section.   OPERATOR:  Malachi Pro. Ambrose Mantle, M.D.   ASSISTANT:  Zenaida Niece, M.D.   Spinal anesthesia.   The patient was brought to the operating room and placed under spinal  anesthesia by Dr. Tacy Dura.  She was placed in the left lateral tilt position.  Fetal heart tones were normal.  The abdomen was prepped with Betadine  solution, the urethra was prepped, and a Foley catheter was inserted to  straight drain.  The patient was placed with her legs flat on the table,  left lateral tilt.  The abdomen was draped as a sterile field, anesthesia  was confirmed, and a transverse incision was made about an inch and a half  above the old incision, which was right on the pubic bone, and carried in  layers through the skin, subcutaneous tissue and fascia in a transverse  direction.  The fascia was then separated from the rectus muscle superiorly  and inferiorly, and the rectus muscle was split and cut in the midline.  The  peritoneum was opened vertically.  Incision was then made into the lower  uterine segment peritoneum and extended laterally, and the bladder was  pushed inferiorly.  The incision was made into the lower uterine segment.  When I got down close to the amniotic  sac, I used a hemostat to enter the  amniotic sac.  I enlarged the uterine incision by pulling superiorly and  inferiorly on the uterine incision.  The vertex was right under the  incision.  With the aid of fundal pressure by Dr. Jackelyn Knife, I delivered the  head through the incisional opening, suctioned the nose and mouth, and then  delivered the rest of the baby without difficulty.  The cord was clamped,  and the infant was given to the neonatologist who was in attendance by Dr.  Jackelyn Knife.  It was a female infant, 7 pounds 12 ounces, with Apgars of 8 and 9  at one and five minutes.  A segment of cord was preserved in case a pH was  necessary.  Routine cord blood studies were obtained.  The placenta was  removed from the uterus.  Inside of the uterus was inspected and found to be  free of any products of conception.  There was at least one fibroid on the  anterior uterine surface, about 2 cm in  diameter.  The uterine incision was  closed with two running sutures of 0 Vicryl, locking the first layer and  nonlocking on the second layer.  Hemostasis was achieved with one extra  suture of 0 Vicryl.  A couple of spots were touched with the Bovie for  hemostasis.  Gutters were blotted free of blood.  Both tubes and ovaries  appeared normal.  The abdominal wall was then closed in layers after I  opened the cervix with a ring forceps with interrupted sutures of 0 Vicryl  on the rectus muscle and peritoneum combined, two running sutures of 0  Vicryl in the fascia, a running  3-0 Vicryl on the subcu tissue, and staples on the skin.  The patient seemed  to tolerate the procedure well.  Blood loss was about 800 mL.  Sponge and  needle counts were correct, and she was returned to recovery in satisfactory  condition.      TFH/MEDQ  D:  05/04/2004  T:  05/04/2004  Job:  [id number]

## 2011-02-14 ENCOUNTER — Encounter: Payer: Self-pay | Admitting: Family Medicine

## 2011-02-14 ENCOUNTER — Ambulatory Visit (INDEPENDENT_AMBULATORY_CARE_PROVIDER_SITE_OTHER): Payer: Self-pay | Admitting: Family Medicine

## 2011-02-14 VITALS — BP 114/76 | HR 104 | Temp 101.6°F | Ht 62.0 in | Wt 168.9 lb

## 2011-02-14 DIAGNOSIS — J029 Acute pharyngitis, unspecified: Secondary | ICD-10-CM

## 2011-02-14 LAB — POCT RAPID STREP A (OFFICE): Rapid Strep A Screen: NEGATIVE

## 2011-02-14 MED ORDER — DOXYCYCLINE HYCLATE 100 MG PO TABS: 100.0000 mg | ORAL_TABLET | Freq: Two times a day (BID) | ORAL | Status: AC

## 2011-02-14 MED ORDER — LORATADINE 10 MG PO TABS: 10.0000 mg | ORAL_TABLET | Freq: Every day | ORAL | Status: DC

## 2011-02-14 MED ORDER — FLUTICASONE PROPIONATE 50 MCG/ACT NA SUSP: 2.0000 | Freq: Every day | NASAL | Status: DC

## 2011-02-14 NOTE — Progress Notes (Signed)
  Subjective:    Patient ID: [name], female    DOB: [birth year], 40 y.o.   MRN: [id number]  HPI  [name] is a 40 y.o. female who presents for evaluation of sinus pain. Symptoms include: congestion, cough, fevers, headaches, nasal congestion, sinus pressure and sneezing. Onset of symptoms was 4 days ago. Symptoms have been gradually worsening since that time. Past history is significant for no history of pneumonia or bronchitis. Patient is a non-smoker.  The following portions of the patient's history were reviewed and updated as appropriate: allergies, past medical history and problem list.  Review of Systems Pertinent items are noted in HPI.   Objective:    General appearance: alert, cooperative and no distress Head: Normocephalic, without obvious abnormality, atraumatic Eyes: conjunctivae/corneas clear. PERRL, EOM's intact. Fundi benign. Ears: normal TM's and external ear canals both ears Nose: Nares normal. Septum midline. Mucosa normal. No drainage or sinus tenderness. Throat: lips, mucosa, and tongue normal; teeth and gums normal Neck: no adenopathy and supple, symmetrical, trachea midline Lungs: clear to auscultation bilaterally Heart: regular rate and rhythm, S1, S2 normal, no murmur, click, rub or gallop Abdomen: soft, non-tender; bowel sounds normal; no masses,  no organomegaly Skin: Skin color, texture, turgor normal. No rashes or lesions    Assessment:    Acute bacterial sinusitis.    Plan:    Nasal steroids per medication orders. Antihistamines per medication orders. Doxycycline per medication orders. Follow up in 2 weeks or as needed.   Review of Systems      Physical Exam

## 2011-02-14 NOTE — Patient Instructions (Signed)
Please return to clinic if symptoms do not improve in 2 weeks.  Sinusitis Sinuses are air pockets within the bones of your face. The growth of bacteria within a sinus leads to infection. The infection prevents the sinuses from draining. This infection is called sinusitis. SYMPTOMS  There will be different areas of pain depending on which sinuses have become infected.  The maxillary sinuses often produce pain beneath the eyes.   Frontal sinusitis may cause pain in the middle of the forehead and above the eyes.  Other problems (symptoms) include:  Toothaches.   Colored, pus-like (purulent) drainage from the nose.   Swelling, warmth, and tenderness over the sinus areas may be signs of infection.  TREATMENT  Sinusitis is most often determined by an exam.X-rays may be taken. If x-rays have been taken, make sure you obtain your results or find out how you are to obtain them. Your caregiver may give you medications (antibiotics). These are medications that will help kill the bacteria causing the infection. You may also be given a medication (decongestant) that helps to reduce sinus swelling.  HOME CARE INSTRUCTIONS   Only take over-the-counter or prescription medicines for pain, discomfort, or fever as directed by your caregiver.   Drink extra fluids. Fluids help thin the mucus so your sinuses can drain more easily.   Applying either moist heat or ice packs to the sinus areas may help relieve discomfort.   Use saline nasal sprays to help moisten your sinuses. The sprays can be found at your local drugstore.  SEEK IMMEDIATE MEDICAL CARE IF:  You have a fever.   You have increasing pain, severe headaches, or toothache.   You have nausea, vomiting, or drowsiness.   You develop unusual swelling around the face or trouble seeing.  MAKE SURE YOU:   Understand these instructions.   Will watch your condition.   Will get help right away if you are not doing well or get worse.     Document Released: 03/04/2005 Document Revised: 11/14/2010 Document Reviewed: 10/01/2006 [facility] Patient Information 2012 [place], [place].

## 2011-04-26 ENCOUNTER — Other Ambulatory Visit: Payer: Self-pay | Admitting: Sports Medicine

## 2011-04-26 NOTE — Telephone Encounter (Signed)
Refill request

## 2012-02-05 ENCOUNTER — Other Ambulatory Visit (HOSPITAL_COMMUNITY)
Admission: RE | Admit: 2012-02-05 | Discharge: 2012-02-05 | Disposition: A | Discharge status: Home / Self Care | Payer: 59 | Source: Ambulatory Visit | Attending: Family Medicine | Admitting: Family Medicine

## 2012-02-05 ENCOUNTER — Encounter: Payer: Self-pay | Admitting: Family Medicine

## 2012-02-05 ENCOUNTER — Ambulatory Visit (INDEPENDENT_AMBULATORY_CARE_PROVIDER_SITE_OTHER): Payer: 59 | Admitting: Family Medicine

## 2012-02-05 VITALS — BP 114/72 | HR 77 | Temp 98.3°F | Ht 63.0 in | Wt 175.1 lb

## 2012-02-05 DIAGNOSIS — Z01419 Encounter for gynecological examination (general) (routine) without abnormal findings: Secondary | ICD-10-CM | POA: Insufficient documentation

## 2012-02-05 DIAGNOSIS — Z124 Encounter for screening for malignant neoplasm of cervix: Secondary | ICD-10-CM

## 2012-02-05 DIAGNOSIS — N939 Abnormal uterine and vaginal bleeding, unspecified: Secondary | ICD-10-CM | POA: Insufficient documentation

## 2012-02-05 DIAGNOSIS — Z1151 Encounter for screening for human papillomavirus (HPV): Secondary | ICD-10-CM | POA: Insufficient documentation

## 2012-02-05 DIAGNOSIS — N926 Irregular menstruation, unspecified: Secondary | ICD-10-CM

## 2012-02-05 LAB — CBC
HCT: 33 % — ABNORMAL LOW (ref 36.0–46.0)
MCH: 27.5 pg (ref 26.0–34.0)
RBC: 3.89 MIL/uL (ref 3.87–5.11)
RDW: 14.8 % (ref 11.5–15.5)
WBC: 5.2 10*3/uL (ref 4.0–10.5)

## 2012-02-05 LAB — COMPREHENSIVE METABOLIC PANEL
Albumin: 3.9 g/dL (ref 3.5–5.2)
BUN: 12 mg/dL (ref 6–23)
Creat: 0.73 mg/dL (ref 0.50–1.10)
Potassium: 3.7 mEq/L (ref 3.5–5.3)
Sodium: 141 mEq/L (ref 135–145)
Total Bilirubin: 0.5 mg/dL (ref 0.3–1.2)
Total Protein: 7.1 g/dL (ref 6.0–8.3)

## 2012-02-05 MED ORDER — HYDROCHLOROTHIAZIDE 12.5 MG PO CAPS: 12.5000 mg | ORAL_CAPSULE | Freq: Every day | ORAL | Status: DC

## 2012-02-05 NOTE — Patient Instructions (Addendum)
Thanks for coming to see me today, [name]. If lab work is abnormal, I will call you in the next 1-2 days.  If normal., I will send a letter. Please to go [facility] for your pelvic US to evaluate fibroids. Remember to schedule yearly mammogram this year. Schedule next annual physical with me in one year.

## 2012-02-05 NOTE — Progress Notes (Signed)
  Subjective:     [name] is a 41 y.o. female and is here for a comprehensive physical exam. The patient reports problems - abormal uterine bleeding.  Patient has had irregular bleeding x 3 months.  Last month, she had a heavy period 3 times.  Patient says her periods were regular prior to 3 months ago.  Patient also says she has a hx of a small uterine fibroid in 2008.  She complains of associated fatigue, but no CP or SOB.  Denies any pelvic pain.  She takes iron supplements every other day for hx anemia.    History   Social History  . Marital Status: Single    Spouse Name: N/A    Number of Children: N/A  . Years of Education: N/A   Social History Main Topics  . Smoking status: Never Smoker   . Alcohol Use: No  . Drug Use: No  . Sexually Active: Not Currently    Health Maintenance  Topic Date Due  . Pap Smear  03/29/1986  . Influenza Vaccine  11/17/2011  . Tetanus/tdap  01/06/2017    The following portions of the patient's history were reviewed and updated as appropriate: allergies, current medications, past medical history and problem list.  Review of Systems Pertinent items are noted in HPI.   Objective:  BP 114/72  Pulse 77  Temp 98.3 F (36.8 C) (Oral)  Ht 5\' 3"  (1.6 m)  Wt 175 lb 1.6 oz (79.425 kg)  BMI 31.02 kg/m2  General Appearance:    Alert, cooperative, no distress, appears stated age  Head:    Normocephalic, without obvious abnormality, atraumatic  Eyes:    PERRL, conjunctiva/corneas clear, EOM's intact, fundi    benign, both eyes  Throat:   Lips, mucosa, and tongue normal; teeth and gums normal  Neck:   Supple, symmetrical, trachea midline, no adenopathy  Lungs:     Clear to auscultation bilaterally, respirations unlabored   Heart:    Regular rate and rhythm, S1 and S2 normal, no murmur, rub   or gallop  Breast Exam:    No tenderness, masses, or nipple abnormality  Abdomen:     Soft, non-tender, bowel sounds active all four quadrants,    no  masses, no organomegaly  Genitalia:    Normal female without lesion, discharge or tenderness  Extremities:   Extremities normal, atraumatic, no cyanosis or edema  Pulses:   2+ and symmetric all extremities  Skin:   Skin color, texture, turgor normal, no rashes or lesions  Neurologic:   No focal deficits     Assessment:    Healthy female exam.  Abnormal Uterine Bleeding.     Plan:    See Problem List

## 2012-02-05 NOTE — Assessment & Plan Note (Addendum)
Hx of uterine fibroids, but now with increasing irregular bleeding.  May be due to early menopause, but will rule out other causes as well. - Check CBC and TSH and CMET - Repeat transvaginal U/S and pelvic U/S (last one was in 2008) - Will notify of results - Consider pregnancy test if patient continues to have irregular bleeding

## 2012-02-07 ENCOUNTER — Encounter: Payer: Self-pay | Admitting: Family Medicine

## 2012-02-17 ENCOUNTER — Ambulatory Visit (HOSPITAL_COMMUNITY)
Admission: RE | Admit: 2012-02-17 | Discharge: 2012-02-17 | Disposition: A | Discharge status: Home / Self Care | Payer: 59 | Source: Ambulatory Visit | Attending: Family Medicine | Admitting: Family Medicine

## 2012-02-17 DIAGNOSIS — N926 Irregular menstruation, unspecified: Secondary | ICD-10-CM

## 2012-02-17 DIAGNOSIS — N949 Unspecified condition associated with female genital organs and menstrual cycle: Secondary | ICD-10-CM | POA: Insufficient documentation

## 2012-02-17 DIAGNOSIS — D252 Subserosal leiomyoma of uterus: Secondary | ICD-10-CM | POA: Insufficient documentation

## 2012-02-17 DIAGNOSIS — N938 Other specified abnormal uterine and vaginal bleeding: Secondary | ICD-10-CM | POA: Insufficient documentation

## 2012-02-18 ENCOUNTER — Telehealth: Payer: Self-pay | Admitting: Family Medicine

## 2012-02-18 NOTE — Telephone Encounter (Signed)
Spoke with patient and appointment set for 12/16 @ 10:30am with Dr Tye Savoy

## 2012-02-18 NOTE — Telephone Encounter (Signed)
Please call and schedule follow up appointment with me to discuss imaging results and follow up irregular menses.  Thanks.

## 2012-03-02 ENCOUNTER — Encounter: Payer: Self-pay | Admitting: Family Medicine

## 2012-03-02 ENCOUNTER — Ambulatory Visit (INDEPENDENT_AMBULATORY_CARE_PROVIDER_SITE_OTHER): Payer: 59 | Admitting: Family Medicine

## 2012-03-02 VITALS — BP 119/82 | HR 90 | Ht 63.0 in | Wt 171.0 lb

## 2012-03-02 DIAGNOSIS — D509 Iron deficiency anemia, unspecified: Secondary | ICD-10-CM

## 2012-03-02 DIAGNOSIS — D259 Leiomyoma of uterus, unspecified: Secondary | ICD-10-CM

## 2012-03-02 NOTE — Progress Notes (Signed)
  Subjective:    Patient ID: [name], female    DOB: 10/01/1970, 41 y.o.   MRN: [id number]  HPI  Patient returns to clinic to discuss recent results of pelvic ultrasound and lab results.   Irregular uterine bleeding:  Has been going on for several months. TSH and CMET within normal limits.  Pelvic US did show small anterior uterine fibroid that was seen in 2008.  Normal ovaries and endometrium.  Patient has tried taking OCP in the past, but she says they made her feel nauseated so she is not interested in taking them.  She is interested in a referral to GYN to discuss further treatment options for uterine fibroid and bleeding.  Denies any pelvic pain, but is annoyed by irregular, frequent bleeding.  Anemia:  Hemoglobin low 10.7 which is a bit lower than baseline.   Patient continues to have irregular bleeding.  Bled 3 times last month - heavy bleeding that lasts 6-7 days.  Has bled once this month so far.  Patient denies any CP, fatigue, SOB at this time. She does take Iron supplements but only a few days per week due to constipation.     Review of Systems  Per HPI    Objective:   Physical Exam  Constitutional: She appears well-nourished. No distress.       No physical exam today.  Spent 15 minutes discussing recent lab and imaging results and treatment options.  Skin: No pallor.      Assessment & Plan:

## 2012-03-02 NOTE — Patient Instructions (Addendum)
It was nice to see you again, [name]. Please call [facility] [phone] and schedule appointment with [facility] to discuss fibroids. If you decide you want to take oral contraceptives, please call your doctor. Continue to take Iron supplements twice a day with meals. Continue Miralax daily as needed for constipation. Schedule follow up appointment with me as needed.

## 2012-03-02 NOTE — Assessment & Plan Note (Addendum)
Small anterior fibroid seen again on repeat pelvic ultrasound.  May be cause of irregular bleeding or this could be pre-menopausal. - Recommended trial of OCP, but patient says they make her feel sick, and declined - Recommended scheduling appointment with [facility] to discuss treatment of uterine fibroid - Patient has [facility] and will call to make an appointment

## 2012-03-02 NOTE — Assessment & Plan Note (Signed)
Hemoglobin 10.7 at last visit.  She does not take Iron supplement daily due to constipation - Advised that she take Iron BIDWC with stool softener - Repeat CBC at next visit when patient is more compliant

## 2012-04-21 ENCOUNTER — Ambulatory Visit (INDEPENDENT_AMBULATORY_CARE_PROVIDER_SITE_OTHER): Payer: 59 | Admitting: Family Medicine

## 2012-04-21 VITALS — BP 130/90 | HR 85 | Temp 98.8°F | Ht 63.0 in | Wt 170.0 lb

## 2012-04-21 DIAGNOSIS — R52 Pain, unspecified: Secondary | ICD-10-CM

## 2012-04-21 NOTE — Patient Instructions (Addendum)
Thank you for coming in today, it was good to see you I am not sure what the exact cause of your pain is.  I think it may be musculoskeletal in origin I would suggest trying aleve to see if this is helpful If this continues please follow up with [place].

## 2012-04-23 DIAGNOSIS — R52 Pain, unspecified: Secondary | ICD-10-CM | POA: Insufficient documentation

## 2012-04-23 NOTE — Assessment & Plan Note (Addendum)
Unsure of etiology of pain.  Pain is not present today and unable to elicit anything with exam.  No swelling or weakness to suggest neurological or rheumatological disorder. Possibly MSK in nature, advised trying advil or aleve to see if this is helpful.

## 2012-04-23 NOTE — Progress Notes (Signed)
  Subjective:    Patient ID: [name], female    DOB: 1970-08-03, 42 y.o.   MRN: [id number]  HPI 1. Pain: c/o pain in multiple areas.  Pain has been off and on since this past Friday.  She has had no pain today.  Points to L axillae, R shoulder, middle of back, back of head, R side of face as areas that have been painful.  .  Not associated with anything or any activity.  She has not seen any joint swelling and denies fever, chills, weakness.   Review of Systems Per HPI    Objective:   Physical Exam  Constitutional: She appears well-nourished. No distress.  HENT:  Head: Normocephalic and atraumatic.  Musculoskeletal:       No tenderness to palpation along areas where she describes pain.  There is not visible swelling or erythema.  Her rom in upper extremity joints and neck is normal.  She is neurologically intact.            Assessment & Plan:

## 2012-04-29 ENCOUNTER — Emergency Department (HOSPITAL_COMMUNITY)
Admission: EM | Admit: 2012-04-29 | Discharge: 2012-04-29 | Disposition: A | Discharge status: Home / Self Care | Payer: No Typology Code available for payment source | Attending: Emergency Medicine | Admitting: Emergency Medicine

## 2012-04-29 ENCOUNTER — Encounter (HOSPITAL_COMMUNITY): Payer: Self-pay | Admitting: *Deleted

## 2012-04-29 ENCOUNTER — Emergency Department (HOSPITAL_COMMUNITY): Payer: No Typology Code available for payment source

## 2012-04-29 DIAGNOSIS — Y92410 Unspecified street and highway as the place of occurrence of the external cause: Secondary | ICD-10-CM | POA: Insufficient documentation

## 2012-04-29 DIAGNOSIS — S40029A Contusion of unspecified upper arm, initial encounter: Secondary | ICD-10-CM

## 2012-04-29 DIAGNOSIS — D649 Anemia, unspecified: Secondary | ICD-10-CM | POA: Insufficient documentation

## 2012-04-29 DIAGNOSIS — S335XXA Sprain of ligaments of lumbar spine, initial encounter: Secondary | ICD-10-CM

## 2012-04-29 DIAGNOSIS — Y939 Activity, unspecified: Secondary | ICD-10-CM | POA: Insufficient documentation

## 2012-04-29 DIAGNOSIS — Z79899 Other long term (current) drug therapy: Secondary | ICD-10-CM | POA: Insufficient documentation

## 2012-04-29 DIAGNOSIS — I10 Essential (primary) hypertension: Secondary | ICD-10-CM | POA: Insufficient documentation

## 2012-04-29 DIAGNOSIS — S0990XA Unspecified injury of head, initial encounter: Secondary | ICD-10-CM | POA: Insufficient documentation

## 2012-04-29 DIAGNOSIS — Z8742 Personal history of other diseases of the female genital tract: Secondary | ICD-10-CM | POA: Insufficient documentation

## 2012-04-29 DIAGNOSIS — S139XXA Sprain of joints and ligaments of unspecified parts of neck, initial encounter: Secondary | ICD-10-CM | POA: Insufficient documentation

## 2012-04-29 DIAGNOSIS — S161XXA Strain of muscle, fascia and tendon at neck level, initial encounter: Secondary | ICD-10-CM

## 2012-04-29 MED ORDER — IBUPROFEN 800 MG PO TABS: 800.0000 mg | ORAL_TABLET | Freq: Three times a day (TID) | ORAL | Status: DC

## 2012-04-29 MED ORDER — CYCLOBENZAPRINE HCL 10 MG PO TABS: 10.0000 mg | ORAL_TABLET | Freq: Two times a day (BID) | ORAL | Status: DC | PRN

## 2012-04-29 MED ORDER — CYCLOBENZAPRINE HCL 10 MG PO TABS
10.0000 mg | ORAL_TABLET | Freq: Once | ORAL | Status: AC
  Administered 2012-04-29: 10 mg via ORAL
  Filled 2012-04-29: qty 1
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
## 2012-04-30 NOTE — ED Provider Notes (Signed)
Medical screening examination/treatment/procedure(s) were performed by non-physician practitioner and as supervising physician I was immediately available for consultation/collaboration.   Joya Gaskins, MD 04/30/12 [phone]

## 2012-07-23 ENCOUNTER — Other Ambulatory Visit: Payer: Self-pay

## 2012-07-23 DIAGNOSIS — Z1231 Encounter for screening mammogram for malignant neoplasm of breast: Secondary | ICD-10-CM

## 2012-08-28 ENCOUNTER — Ambulatory Visit
Admission: RE | Admit: 2012-08-28 | Discharge: 2012-08-28 | Disposition: A | Discharge status: Home / Self Care | Payer: 59 | Source: Ambulatory Visit

## 2012-08-28 DIAGNOSIS — Z1231 Encounter for screening mammogram for malignant neoplasm of breast: Secondary | ICD-10-CM

## 2013-01-11 ENCOUNTER — Ambulatory Visit: Payer: 59 | Admitting: Family Medicine

## 2013-01-14 ENCOUNTER — Ambulatory Visit: Payer: 59 | Admitting: Family Medicine

## 2013-01-15 ENCOUNTER — Ambulatory Visit (INDEPENDENT_AMBULATORY_CARE_PROVIDER_SITE_OTHER): Payer: 59 | Admitting: Family Medicine

## 2013-01-15 ENCOUNTER — Encounter: Payer: Self-pay | Admitting: Family Medicine

## 2013-01-15 VITALS — BP 117/81 | HR 99 | Temp 98.5°F | Wt 172.0 lb

## 2013-01-15 DIAGNOSIS — M791 Myalgia, unspecified site: Secondary | ICD-10-CM

## 2013-01-15 DIAGNOSIS — IMO0001 Reserved for inherently not codable concepts without codable children: Secondary | ICD-10-CM

## 2013-01-15 DIAGNOSIS — R52 Pain, unspecified: Secondary | ICD-10-CM

## 2013-01-15 NOTE — Assessment & Plan Note (Signed)
Assessment: generalized musculoskeletal pain without joint effusions or evidence of systemic illness or weakness Plan: check sedimentation rate and CK and encourage patient to take NSAIDs and follow up with PCP in 2 weeks

## 2013-01-15 NOTE — Progress Notes (Signed)
  Subjective:    Patient ID: [name], female    DOB: [birth year], 42 y.o.   MRN: [id number]  HPI  42 year old F who presents with left breast pain. It started under the left breast 2 weeks and radiated to her left shoulder. It has now radiated to the right breast and right side. No drainage from her breast. No lumps or bumps. No trauma. 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 She take motrin occasionally and aspirin 81 mg occasionally. On a scale her pain is a 4/10. No history of surgery on abdomen. LMP this week.   Review of Systems Negative for nausea, vomiting, fever, chills, skin infections, vaginal discharge     Objective:   Physical Exam BP 117/81  Pulse 99  Temp(Src) 98.5 F (36.9 C) (Oral)  Wt 172 lb (78.019 kg)  BMI 30.48 kg/m2  LMP 01/04/2013 Gen: well-appearing, middle-aged woman, overweight Breast: symmetric in appearance, no palpable nodules, no nipple discharge, no axillary adenopathy or nodules Chest wall: no tenderness Cardiovascular: regular rate and rhythm, no murmurs rubs or gallops Abdomen: soft, nondistended, nontender, normoactive bowel sounds, pain increased with flexion of abdominal wall concerning for muscular origin; no evidence of peritonitis Muscle skeletal: 5/5 strength of upper shoulders bilaterally, no joint effusions of the shoulders or knees Skin: warm and dry no rashes       Assessment & Plan:

## 2013-01-15 NOTE — Patient Instructions (Signed)
[name],   It was nice to meet you today. I do not see any concerning finding on physical exam. I would like to do 2 blood tests today to check for muscle inflammation. I will let you know there results. Please take aspirin and ibuprofen as needed. Also, your new doctor is Dr. Jordan Likes.   Sincerely,   Dr. Clinton Sawyer

## 2013-01-20 ENCOUNTER — Telehealth: Payer: Self-pay | Admitting: Family Medicine

## 2013-01-20 NOTE — Telephone Encounter (Signed)
Patient made aware of slightly elevated ESR and CK. She states that she feels better since her visit. I encouraged her to use NSAIDS for pain and follow up for repeat check if the pain worsens. She was in agreement.

## 2013-02-19 ENCOUNTER — Encounter: Payer: Self-pay | Admitting: Family Medicine

## 2013-02-19 ENCOUNTER — Ambulatory Visit (INDEPENDENT_AMBULATORY_CARE_PROVIDER_SITE_OTHER): Payer: 59 | Admitting: Family Medicine

## 2013-02-19 VITALS — BP 121/81 | HR 98 | Temp 98.8°F | Ht 63.0 in | Wt 175.0 lb

## 2013-02-19 DIAGNOSIS — N926 Irregular menstruation, unspecified: Secondary | ICD-10-CM

## 2013-02-19 DIAGNOSIS — N939 Abnormal uterine and vaginal bleeding, unspecified: Secondary | ICD-10-CM

## 2013-02-19 DIAGNOSIS — D259 Leiomyoma of uterus, unspecified: Secondary | ICD-10-CM

## 2013-02-19 LAB — CBC
MCH: 27.5 pg (ref 26.0–34.0)
MCV: 83.8 fL (ref 78.0–100.0)
Platelets: 308 10*3/uL (ref 150–400)
RBC: 3.89 MIL/uL (ref 3.87–5.11)

## 2013-02-19 MED ORDER — MEDROXYPROGESTERONE ACETATE 10 MG PO TABS: 10.0000 mg | ORAL_TABLET | Freq: Every day | ORAL | Status: DC

## 2013-02-19 NOTE — Assessment & Plan Note (Signed)
Start Provera 10mg  x 1 days Pt to schedule uterine biopsy at next appt in 2 wks w/ PCP May need referral to GYN for ablation vs hysterectomy though I think that may bee too aggressive at this time CBC today to evaluate anemia

## 2013-02-19 NOTE — Patient Instructions (Addendum)
The cause of your uterine bleeding is unclear. We need to try hormone therapy and then obtain a biopsy before sending you to the GYN surgery specialists Please come back in 2 weeks for a biopsy by Dr. Jordan Likes

## 2013-02-19 NOTE — Progress Notes (Signed)
[name] is a 42 y.o. female who presents to [facility] today for vaginal bleeding   Vaginal bleeding: ongiong for 2 weeks. Daily. Menstrual period is typically 7 days, and started 7 days ago. Second week discharge has only been clots, no more fresh blood until today. Changing pad 2-3 times a day over past week. Started Motrin on Monday w/ some relief. Has not needed w/ past periods. Sexually active w/o protection but w/ BTL. MOther went into menopause at 57. Denies fevers, n/v/d. Vaginal purulent discharge,pain, dysuria, frequency. Constipated in general w/ BM QOD to every 3 days.  Has tried Depo shot and bled for 3 mo.  Wants consult to GYN for fibroid removal.   The following portions of the patient's history were reviewed and updated as appropriate: allergies, current medications, past medical history, family and social history, and problem list.  Patient is a nonsmoker.   Past Medical History  Diagnosis Date  . Hypertension   . Anemia   . Allergy   . Uterine fibroid     ROS as above otherwise neg.    Medications reviewed. Current Outpatient Prescriptions  Medication Sig Dispense Refill  . cyclobenzaprine (FLEXERIL) 10 MG tablet Take 1 tablet (10 mg total) by mouth 2 (two) times daily as needed for muscle spasms.  20 tablet  0  . ferrous sulfate 325 (65 FE) MG tablet Take 325 mg by mouth 2 (two) times daily.        . hydrochlorothiazide (MICROZIDE) 12.5 MG capsule Take 1 capsule (12.5 mg total) by mouth daily.  30 capsule  5  . ibuprofen (ADVIL,MOTRIN) 200 MG tablet Take 800 mg by mouth every 6 (six) hours as needed for pain.      Marland Kitchen ibuprofen (ADVIL,MOTRIN) 800 MG tablet Take 1 tablet (800 mg total) by mouth 3 (three) times daily.  21 tablet  0  . medroxyPROGESTERone (PROVERA) 10 MG tablet Take 1 tablet (10 mg total) by mouth daily.  10 tablet  0  . omeprazole (PRILOSEC) 20 MG capsule Take 20 mg by mouth daily.       No current facility-administered medications for this visit.     Exam: BP 121/81  Pulse 98  Temp(Src) 98.8 F (37.1 C) (Oral)  Ht 5\' 3"  (1.6 m)  Wt 175 lb (79.379 kg)  BMI 31.01 kg/m2 Gen: Well NAD HEENT: EOMI,  MMM    No results found for this or any previous visit (from the past 72 hour(s)).  A/P (as seen in Problem list)  UTERINE FIBROID Small subserosal fibroid unlikely to be the source of uterine bleeding but a possibility  Abnormal uterine bleeding Start Provera 10mg  x 1 days Pt to schedule uterine biopsy at next appt in 2 wks w/ PCP May need referral to GYN for ablation vs hysterectomy though I think that may bee too aggressive at this time CBC today to evaluate anemia    Spent >25 min in direct pt care

## 2013-02-19 NOTE — Assessment & Plan Note (Signed)
Small subserosal fibroid unlikely to be the source of uterine bleeding but a possibility

## 2013-04-13 ENCOUNTER — Telehealth: Payer: Self-pay | Admitting: Family Medicine

## 2013-04-13 NOTE — Telephone Encounter (Signed)
Patient is needing a refill of hydrochlorothiazide called in to [place] at [facility].  She is out of it and has an appt scheduled on Monday.

## 2013-04-14 ENCOUNTER — Other Ambulatory Visit: Payer: Self-pay | Admitting: Family Medicine

## 2013-04-14 DIAGNOSIS — I10 Essential (primary) hypertension: Secondary | ICD-10-CM

## 2013-04-14 MED ORDER — HYDROCHLOROTHIAZIDE 12.5 MG PO CAPS: 12.5000 mg | ORAL_CAPSULE | Freq: Every day | ORAL | Status: DC

## 2013-04-14 NOTE — Telephone Encounter (Signed)
Refilled chronic medications

## 2013-04-19 ENCOUNTER — Encounter: Payer: Self-pay | Admitting: Family Medicine

## 2013-04-19 ENCOUNTER — Ambulatory Visit (INDEPENDENT_AMBULATORY_CARE_PROVIDER_SITE_OTHER): Payer: 59 | Admitting: Family Medicine

## 2013-04-19 VITALS — BP 128/76 | HR 73 | Temp 99.3°F | Ht 63.0 in | Wt 169.0 lb

## 2013-04-19 DIAGNOSIS — H524 Presbyopia: Secondary | ICD-10-CM

## 2013-04-19 DIAGNOSIS — I10 Essential (primary) hypertension: Secondary | ICD-10-CM

## 2013-04-19 DIAGNOSIS — Z131 Encounter for screening for diabetes mellitus: Secondary | ICD-10-CM

## 2013-04-19 LAB — LIPID PANEL
CHOLESTEROL: 120 mg/dL (ref 0–200)
HDL: 44 mg/dL (ref >39)
LDL CALC: 67 mg/dL (ref 0–99)
TRIGLYCERIDES: 45 mg/dL (ref <150)
Total CHOL/HDL Ratio: 2.7 Ratio
VLDL: 9 mg/dL (ref 0–40)

## 2013-04-19 LAB — BASIC METABOLIC PANEL
BUN: 14 mg/dL (ref 6–23)
CHLORIDE: 104 meq/L (ref 96–112)
CO2: 27 mEq/L (ref 19–32)
CREATININE: 0.77 mg/dL (ref 0.50–1.10)
Calcium: 9.3 mg/dL (ref 8.4–10.5)
GLUCOSE: 93 mg/dL (ref 70–99)
POTASSIUM: 4.1 meq/L (ref 3.5–5.3)
Sodium: 141 mEq/L (ref 135–145)

## 2013-04-19 LAB — TSH: TSH: 3.027 u[IU]/mL (ref 0.350–4.500)

## 2013-04-19 LAB — POCT GLYCOSYLATED HEMOGLOBIN (HGB A1C): Hemoglobin A1C: 5.5
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
## 2013-04-19 NOTE — Patient Instructions (Signed)
Thank you for coming in,   We will get labs today and I will call you with the results.    Based on what you have told me I think you have Presbyopia, which occurs naturally with aging. Try some stronger reading glasses and if this doesn't work then you may need to go to an Optometrist.   Please follow up with me in 6 months or sooner if needed.    Please feel free to call with any questions or concerns at any time, at [phone]. --Dr. Raeford Razor

## 2013-04-19 NOTE — Assessment & Plan Note (Addendum)
Changes in vision are most likely related to presbyopia. She passed vision exam.  - told her to try a new pair of reading glasses  - Hgb A1c  - if worsening can refer to optometrist

## 2013-04-19 NOTE — Assessment & Plan Note (Signed)
Sent in refill of Maxzide last week. BP under good control  - BMP, lipid panel  - f/u in 6 months

## 2013-04-20 NOTE — Telephone Encounter (Signed)
Message copied by Corinna Capra on Tue Apr 20, 2013 10:08 AM ------      Message from: Rosemarie Ax      Created: Tue Apr 20, 2013  8:43 AM       Please call Ms. Crute and let her know that her labs are normal. Thank you. ------

## 2013-04-20 NOTE — Telephone Encounter (Signed)
Relayed message,patient voiced understanding. [name]  

## 2013-09-01 ENCOUNTER — Ambulatory Visit (INDEPENDENT_AMBULATORY_CARE_PROVIDER_SITE_OTHER): Payer: 59 | Admitting: Family Medicine

## 2013-09-01 ENCOUNTER — Encounter: Payer: Self-pay | Admitting: Family Medicine

## 2013-09-01 VITALS — BP 112/77 | HR 80 | Temp 98.3°F | Ht 63.0 in | Wt 175.9 lb

## 2013-09-01 DIAGNOSIS — M545 Low back pain, unspecified: Secondary | ICD-10-CM

## 2013-09-01 DIAGNOSIS — R350 Frequency of micturition: Secondary | ICD-10-CM

## 2013-09-01 LAB — POCT UA - MICROSCOPIC ONLY

## 2013-09-01 LAB — POCT URINALYSIS DIPSTICK
BILIRUBIN UA: NEGATIVE
Glucose, UA: NEGATIVE
KETONES UA: NEGATIVE
LEUKOCYTES UA: NEGATIVE
Nitrite, UA: NEGATIVE
PH UA: 5.5
PROTEIN UA: NEGATIVE
Urobilinogen, UA: 0.2

## 2013-09-01 NOTE — Patient Instructions (Signed)
Try some OTC zyrtec for allergies I am giving you a handout for the back exercises. Try them 4-6 weeks. We will send you a nite about your UA.

## 2013-09-02 ENCOUNTER — Encounter: Payer: Self-pay | Admitting: Family Medicine

## 2013-09-02 ENCOUNTER — Telehealth: Payer: Self-pay | Admitting: Family Medicine

## 2013-09-02 NOTE — Telephone Encounter (Signed)
Please call patient back with results from labs taken yesterday

## 2013-09-06 ENCOUNTER — Telehealth: Payer: Self-pay | Admitting: Family Medicine

## 2013-09-06 NOTE — Telephone Encounter (Signed)
Spoke to patient,states she received labs results without explanation.States still have severe back pain and would to like to know what she's needing to do.Please advise.Thank you.Proposito, Lewie Loron

## 2013-09-06 NOTE — Telephone Encounter (Signed)
Patient received her lab results in mail today, but would like to speak to nurse or MD to have results explained to her. Please call patient.

## 2013-09-07 NOTE — Progress Notes (Signed)
   Subjective:    Patient ID: [name], female    DOB: 1971-02-21, 43 y.o.   MRN: [id number]  HPI Several days to weeks of mid to lower back pain. Worse after she works a long shift. No fever. Potentially she's had some increase in urinary frequency but no, pain. No blood in her urine. Back pain does not radiate down to her buttock. She's had no incontinence of bowel or bladder. No lower extremity weakness.   Review of Systems See history of present illness.    Objective:   Physical Exam  Vital signs reviewed. GENERAL: Well-developed, well-nourished, no acute distress. CARDIOVASCULAR: Regular rate and rhythm no murmur gallop or rub LUNGS: Clear to auscultation bilaterally, no rales or wheeze. ABDOMEN: Soft positive bowel sounds BACK: No CVA tenderness to percussion. Mild lumbar spasm is noted right greater than left. Mild to palpation over the iliac crest on the right in the muscle insertion area. Limited flexion at the hips secondary to hamstring low back tightness. Can hyperextend without any pain. Normal extremity strength. Negative straight leg raise bilaterally. MSK: Movement of extremity x 4.        Assessment & Plan:  Mid to low back pain which I think is musculoskeletal. I discussed this with her. She is fairly convinced that this is related to ITII but UA is negative. . I gave her handout on low back exercises and she should followup with her PCP.

## 2013-09-07 NOTE — Telephone Encounter (Signed)
APPEND Dear Dema Severin Team I am sorry --I DID NOT GIVE HER ANTIVBOTIIICS as her UA was negative [name]

## 2013-09-07 NOTE — Telephone Encounter (Signed)
Please forward to PCP. I did not see this pt.

## 2013-09-07 NOTE — Telephone Encounter (Signed)
Relayed message,patient voiced understanding. Proposito, Giovanna S  

## 2013-09-07 NOTE — Telephone Encounter (Signed)
Dear [name] Team I sent her a letter--she says she received it. I gave her antibiotics. If she is still having back pain issues, she should see her PCP. I DID tell her that her back pain was musculoskeletal and I did not think the antibiotic would help her. I also gave her some back exercises. THANKS! [name]

## 2013-09-07 NOTE — Telephone Encounter (Signed)
Pt is upset because she is still in pain. She said she was told she had traces of blood in her urine. What is she suppose to do-she is still in pain. Please call Cell phone: [phone]

## 2013-11-10 ENCOUNTER — Other Ambulatory Visit: Payer: Self-pay

## 2013-11-10 DIAGNOSIS — Z1231 Encounter for screening mammogram for malignant neoplasm of breast: Secondary | ICD-10-CM

## 2013-11-19 ENCOUNTER — Ambulatory Visit
Admission: RE | Admit: 2013-11-19 | Discharge: 2013-11-19 | Disposition: A | Discharge status: Home / Self Care | Payer: 59 | Source: Ambulatory Visit

## 2013-11-19 ENCOUNTER — Encounter (INDEPENDENT_AMBULATORY_CARE_PROVIDER_SITE_OTHER): Payer: Self-pay

## 2013-11-19 DIAGNOSIS — Z1231 Encounter for screening mammogram for malignant neoplasm of breast: Secondary | ICD-10-CM

## 2014-05-25 ENCOUNTER — Ambulatory Visit (INDEPENDENT_AMBULATORY_CARE_PROVIDER_SITE_OTHER): Payer: No Typology Code available for payment source | Admitting: Family Medicine

## 2014-05-25 ENCOUNTER — Encounter: Payer: Self-pay | Admitting: Family Medicine

## 2014-05-25 VITALS — BP 122/82 | HR 68 | Temp 98.2°F | Ht 63.0 in | Wt 175.3 lb

## 2014-05-25 DIAGNOSIS — H538 Other visual disturbances: Secondary | ICD-10-CM

## 2014-05-25 DIAGNOSIS — D509 Iron deficiency anemia, unspecified: Secondary | ICD-10-CM

## 2014-05-25 LAB — POCT GLYCOSYLATED HEMOGLOBIN (HGB A1C): HEMOGLOBIN A1C: 6

## 2014-05-25 NOTE — Assessment & Plan Note (Addendum)
Pt with h/o of anemia and current blurry vision/dizziness. CBC today Pt will be called with results. Encouraged iron supplementation.

## 2014-05-25 NOTE — Patient Instructions (Signed)
Please watch her exercise and diet more closely, follow up with your primary care physician in one month. Exercise 150 minutes a week. Call your ophthalmologist today to schedule appointment.  Diabetes Mellitus and Food It is important for you to manage your blood sugar (glucose) level. Your blood glucose level can be greatly affected by what you eat. Eating healthier foods in the appropriate amounts throughout the day at about the same time each day will help you control your blood glucose level. It can also help slow or prevent worsening of your diabetes mellitus. Healthy eating may even help you improve the level of your blood pressure and reach or maintain a healthy weight.  HOW CAN FOOD AFFECT ME? Carbohydrates Carbohydrates affect your blood glucose level more than any other type of food. Your dietitian will help you determine how many carbohydrates to eat at each meal and teach you how to count carbohydrates. Counting carbohydrates is important to keep your blood glucose at a healthy level, especially if you are using insulin or taking certain medicines for diabetes mellitus. Alcohol Alcohol can cause sudden decreases in blood glucose (hypoglycemia), especially if you use insulin or take certain medicines for diabetes mellitus. Hypoglycemia can be a life-threatening condition. Symptoms of hypoglycemia (sleepiness, dizziness, and disorientation) are similar to symptoms of having too much alcohol.  If your health care provider has given you approval to drink alcohol, do so in moderation and use the following guidelines:  Women should not have more than one drink per day, and men should not have more than two drinks per day. One drink is equal to:  12 oz of beer.  5 oz of wine.  1 oz of hard liquor.  Do not drink on an empty stomach.  Keep yourself hydrated. Have water, diet soda, or unsweetened iced tea.  Regular soda, juice, and other mixers might contain a lot of carbohydrates and  should be counted. WHAT FOODS ARE NOT RECOMMENDED? As you make food choices, it is important to remember that all foods are not the same. Some foods have fewer nutrients per serving than other foods, even though they might have the same number of calories or carbohydrates. It is difficult to get your body what it needs when you eat foods with fewer nutrients. Examples of foods that you should avoid that are high in calories and carbohydrates but low in nutrients include:  Trans fats (most processed foods list trans fats on the Nutrition Facts label).  Regular soda.  Juice.  Candy.  Sweets, such as cake, pie, doughnuts, and cookies.  Fried foods. WHAT FOODS CAN I EAT? Have nutrient-rich foods, which will nourish your body and keep you healthy. The food you should eat also will depend on several factors, including:  The calories you need.  The medicines you take.  Your weight.  Your blood glucose level.  Your blood pressure level.  Your cholesterol level. You also should eat a variety of foods, including:  Protein, such as meat, poultry, fish, tofu, nuts, and seeds (lean animal proteins are best).  Fruits.  Vegetables.  Dairy products, such as milk, cheese, and yogurt (low fat is best).  Breads, grains, pasta, cereal, rice, and beans.  Fats such as olive oil, trans fat-free margarine, canola oil, avocado, and olives. DOES EVERYONE WITH DIABETES MELLITUS HAVE THE SAME MEAL PLAN? Because every person with diabetes mellitus is different, there is not one meal plan that works for everyone. It is very important that you meet with a  dietitian who will help you create a meal plan that is just right for you. Document Released: 11/29/2004 Document Revised: 03/09/2013 Document Reviewed: 01/29/2013 [facility] Patient Information 2015 [place], [place]. This information is not intended to replace advice given to you by your health care provider. Make sure you discuss any questions you have  with your health care provider.

## 2014-05-25 NOTE — Progress Notes (Signed)
   Subjective:    Patient ID: [name], female    DOB: 1970/05/08, 44 y.o.   MRN: [id number]  HPI  Visual changes: Patient presents with a every week history of increased blurred vision, or things seem to look "fuzzy". She states she is occasionally gets dizzy as well, denies syncope, has a history of anemia. Reports she has not had changes in vision today. She also states she feels a pressure pain behind her eyes that is concerning to her and is increased when she is reading or eating. Blurriness is also  increased when reading  or eating. She is prescribed eyeglasses, and has seen the ophthalmologist approximately one year ago. She did not return to have her eyes dilated and have full exam exam. She has a history of gestational diabetes, her last A1c was February 2015 was 5.5. She has a blood glucose monitor at home, and states she has checked her sugar when she has the changes in her vision and it is  normally approximately 120. She denies any erythema, drainage or injections of the eyes. She uses Restasis 2 times a day.  As the appt was ending pt stated she has not had a menstrual cycle in 2 months. This is new for her. She is not on birth control, but had her tubes tied. She is 44 years old. Advised pt to follow up with her PCP concerning this matter if she continues to not have her cycle.   Past Medical History  Diagnosis Date  . Hypertension   . Anemia   . Allergy   . Uterine fibroid    Allergies  Allergen Reactions  . Vicodin [Hydrocodone-Acetaminophen] Shortness Of Breath   Review of Systems History of present illness    Objective:   Physical Exam BP 122/82 mmHg  Pulse 68  Temp(Src) 98.2 F (36.8 C) (Oral)  Ht 5\' 3"  (1.6 m)  Wt 175 lb 4.8 oz (79.516 kg)  BMI 31.06 kg/m2 Gen: NAD. Well developed, well nourished, nontoxic in appearance.  HEENT: AT. [place]. Bilateral eyes without injections, erythema, drainage or icterus. MMM. Bilateral eyes not tender/or with increased  pressure feeling with global pressure. CV: RRR  Visual acuity exam: Both eyes: 20/25, OS 20/40, OD 20/50     Assessment & Plan:

## 2014-05-25 NOTE — Assessment & Plan Note (Addendum)
CBC pending , A1c>>> 6.0, discussed with patient this is increased from prior (5.5). Encouraged her to exercise at least 150 minutes a week and start to follow a stricter diet. With her hisotry of gestational diabetes, she needs to be cautious as her chances of becoming a diabetic is higher. AVS on diabetic diet given. Pt is to follow up with her primary in 4 weeks.  Patient is to see her ophthalmologist ASAP, to have a new evaluation for acuity and have full exam. Her visual acuity exam today suggests that there is a possibility she may need every day glasses, and not just reading glasses. F/U PCP in 4 weeks

## 2014-06-09 ENCOUNTER — Telehealth: Payer: Self-pay | Admitting: Family Medicine

## 2014-06-09 NOTE — Telephone Encounter (Signed)
Noted.  Burna Forts, BSN, RN-BC

## 2014-06-09 NOTE — Telephone Encounter (Signed)
Patient called asking to see her primary doctor because she says her issues at her last visit were not addressed. Patient says she came in for blurred vision but said she was having breast pain and told the doctor about the breast pain but did not address it. Advised patient her primary doctor is booked up until 06/28/14 but if she was having an acute issue she could be worked in with another doctor. Patient then asked if we could refer her to have a mammogram done, she said it had been more than a year since her last one was done. Advised patient she was able to schedule those without a referral at the breast center. Patient then stated her last mammogram she never got the results from her doctor but she did get a letter from the place she got the mammogram. Explained to the patient that she would get the results from the Radiologist, patient then asked why she even has a doctor. Patient was questioning everything I was telling her, then proceeded to say I didn't know what I was talking about since I am not a nurse. Tried to explain to the patient I was not giving her medical advice, I then offered to give the patient the information to schedule her mammogram or make her an appt with another doctor. Patient declined and hung up.

## 2014-06-23 ENCOUNTER — Encounter: Payer: Self-pay | Admitting: Family Medicine

## 2014-06-23 ENCOUNTER — Ambulatory Visit (INDEPENDENT_AMBULATORY_CARE_PROVIDER_SITE_OTHER): Payer: No Typology Code available for payment source | Admitting: Family Medicine

## 2014-06-23 VITALS — BP 115/69 | HR 77 | Temp 98.4°F | Ht 63.0 in | Wt 169.3 lb

## 2014-06-23 DIAGNOSIS — R7309 Other abnormal glucose: Secondary | ICD-10-CM | POA: Diagnosis not present

## 2014-06-23 DIAGNOSIS — N644 Mastodynia: Secondary | ICD-10-CM

## 2014-06-23 DIAGNOSIS — R7303 Prediabetes: Secondary | ICD-10-CM

## 2014-06-23 DIAGNOSIS — M79603 Pain in arm, unspecified: Secondary | ICD-10-CM | POA: Diagnosis not present

## 2014-06-23 LAB — TSH: TSH: 1.153 u[IU]/mL (ref 0.350–4.500)

## 2014-06-23 NOTE — Patient Instructions (Signed)
Thank you for coming in,   I will call you with the lab results from today.    Please feel free to call with any questions or concerns at any time, at [phone]. --Dr. Raeford Razor  Diet Recommendations for Diabetes   Starchy (carb) foods include: Bread, rice, pasta, potatoes, corn, crackers, bagels, muffins, all baked goods.   Protein foods include: Meat, fish, poultry, eggs, dairy foods, and beans such as pinto and kidney beans (beans also provide carbohydrate).   1. Eat at least 3 meals and 1-2 snacks per day. Never go more than 4-5 hours while awake without eating.  2. Limit starchy foods to TWO per meal and ONE per snack. ONE portion of a starchy  food is equal to the following:   - ONE slice of bread (or its equivalent, such as half of a hamburger bun).   - 1/2 cup of a "scoopable" starchy food such as potatoes or rice.   - 1 OUNCE (28 grams) of starchy snack foods such as crackers or pretzels (look on label).   - 15 grams of carbohydrate as shown on food label.  3. Both lunch and dinner should include a protein food, a carb food, and vegetables.   - Obtain twice as many veg's as protein or carbohydrate foods for both lunch and dinner.   - Try to keep frozen veg's on hand for a quick vegetable serving.     - Fresh or frozen veg's are best.  4. Breakfast should always include protein.

## 2014-06-24 ENCOUNTER — Encounter: Payer: Self-pay | Admitting: Family Medicine

## 2014-06-24 ENCOUNTER — Telehealth: Payer: Self-pay | Admitting: Family Medicine

## 2014-06-24 NOTE — Telephone Encounter (Signed)
Left vm indicating to call clinic back. Will give her the results of her TSH.   Rosemarie Ax, MD PGY-2, [facility] 06/24/2014, 8:56 AM

## 2014-06-24 NOTE — Progress Notes (Signed)
Subjective:     Patient ID: [name], female   DOB: 02-Jul-1970, 44 y.o.   MRN: [id number]  HPI  [name] is here for breast pain.   Breast pain: She had a normal mammogram in Fall of 2015. She is worried that she has breast cancer because her friend had normal mammograms then was found to have breast cancer.  The pain is occuring in her left breast on the lateral aspect in the 2 o'clock position near her axilla. She denies any trauma to her breast. She has not found anything on self exam. Denies any correlation to her cycle and no discharge from her nipples. Her paternal grandmother had breast cancer. She denies any shortness of breath or chest pain. Denies any unilateral leg swelling and no recent travel.   Arm pain: occurring with her breast. Reports that is it tingling in nature. Starts in her left axilla, radiates to her anterior forearm and then also across her back to her right shoulder. Denies any weakness or paresthesia associated with it. No history of trauma or injury. No hx of diabetes.    Current Outpatient Prescriptions on File Prior to Visit  Medication Sig Dispense Refill  . cyclobenzaprine (FLEXERIL) 10 MG tablet Take 1 tablet (10 mg total) by mouth 2 (two) times daily as needed for muscle spasms. 20 tablet 0  . ferrous sulfate 325 (65 FE) MG tablet Take 325 mg by mouth 2 (two) times daily.      . hydrochlorothiazide (MICROZIDE) 12.5 MG capsule Take 1 capsule (12.5 mg total) by mouth daily. 90 capsule 3  . ibuprofen (ADVIL,MOTRIN) 200 MG tablet Take 800 mg by mouth every 6 (six) hours as needed for pain.    Marland Kitchen ibuprofen (ADVIL,MOTRIN) 800 MG tablet Take 1 tablet (800 mg total) by mouth 3 (three) times daily. 21 tablet 0  . medroxyPROGESTERone (PROVERA) 10 MG tablet Take 1 tablet (10 mg total) by mouth daily. 10 tablet 0  . omeprazole (PRILOSEC) 20 MG capsule Take 20 mg by mouth daily.     No current facility-administered medications on file prior to visit.    SHx:  Has two children   Health Maintenance: needs HIV screening   Review of Systems See HPI     Objective:   Physical Exam BP 115/69 mmHg  Pulse 77  Temp(Src) 98.4 F (36.9 C) (Oral)  Ht 5\' 3"  (1.6 m)  Wt 169 lb 4.8 oz (76.794 kg)  BMI 30.00 kg/m2  LMP 06/17/2014 Gen: NAD, alert, cooperative with exam, well-appearing Breast: symmetric with no dimpling b/l, no tenderness to palpation, mobile, no discharge from nipple with compression b/l, no LAD in axilla b/l, no masses palpated  MSK: normal grip strength, 5/5 strength in upper ext b/l, FROM of motion of both shoulders b/l, no deformities of shoulder on exam, shoulder and arms are symmetric, no signs of atrophy in hands b/l,  Negative Spurling test b/l  Neuro: no gross deficits.   .    Assessment:        Plan:     See problems based charting.

## 2014-06-25 DIAGNOSIS — M79603 Pain in arm, unspecified: Secondary | ICD-10-CM | POA: Insufficient documentation

## 2014-06-25 DIAGNOSIS — N644 Mastodynia: Secondary | ICD-10-CM | POA: Insufficient documentation

## 2014-06-25 NOTE — Assessment & Plan Note (Signed)
Unilateral and exam reassuring. Normal mammogram in fall of 2015. No first degree relation with breast cancer.  - diagnostic MM; consider 3D mammogram as to avoid unnecessary bx

## 2014-06-25 NOTE — Assessment & Plan Note (Signed)
Unusual distribution but exam reassuring. TSH normal. Unsure if component of neuropathy or radiculopathy as she reports electrical in nature.  - may need to consider starting gabapentin  - consider Cervical imaging if persists.

## 2014-06-29 ENCOUNTER — Telehealth: Payer: Self-pay | Admitting: *Deleted

## 2014-06-29 NOTE — Telephone Encounter (Signed)
LVM for pt to call back so that we can get an appt set up for her diagnostic mammogram, need to see what times she is not available so we can set this up for her.  [name], Ziona Wickens D

## 2014-06-30 ENCOUNTER — Other Ambulatory Visit: Payer: Self-pay | Admitting: Family Medicine

## 2014-06-30 DIAGNOSIS — N644 Mastodynia: Secondary | ICD-10-CM

## 2014-07-07 NOTE — Telephone Encounter (Signed)
LVM for pt to call back so we can get an appt for her diagnostic mammogram, need to see what times she is NOT available so we can get this appt scheduled. [name], [name]

## 2014-07-12 NOTE — Telephone Encounter (Signed)
Spoke with pt and was able to schedule her diagnostic mammogram also while on the phone with the pt she was concerned that she is prediabetic and that she is taking HCTZ and one of the side effects is that it can cause your blood sugar to rise.  She is concerned because she wants to do everything she can not to be diabetic and wanted to know if there was any other medication that she could take that would not cause this.  Told pt I would send message to dr. [name], [name]

## 2014-07-13 NOTE — Telephone Encounter (Signed)
Left voicemail for patient but it cut off before I could complete my message.  We can switch her HTN medication from HCTZ to amlodipine. If she calls the clinic then she can ask for a nurse to rely this information.  If she has any questions about the new medication, then have her leave the best number and time to get ahold of her and I can call her back.    Rosemarie Ax, MD PGY-2, [facility] 07/13/2014, 9:11 AM

## 2014-07-14 NOTE — Telephone Encounter (Signed)
Left voicemail for patient. If she calls clinic back she can speak with nurse and that we can switch her HTN medication from HCTZ to amlodipine. If she calls the clinic then she can ask for a nurse to rely this information. If she has any questions about the new medication, then have her leave the best number and time to get ahold of her and I can call her back.   Rosemarie Ax, MD PGY-2, [facility] 07/14/2014, 12:10 PM

## 2014-07-21 ENCOUNTER — Other Ambulatory Visit: Payer: No Typology Code available for payment source

## 2014-07-29 ENCOUNTER — Ambulatory Visit
Admission: RE | Admit: 2014-07-29 | Discharge: 2014-07-29 | Disposition: A | Discharge status: Home / Self Care | Payer: No Typology Code available for payment source | Source: Ambulatory Visit | Attending: Family Medicine | Admitting: Family Medicine

## 2014-07-29 DIAGNOSIS — N644 Mastodynia: Secondary | ICD-10-CM

## 2014-10-25 ENCOUNTER — Other Ambulatory Visit: Payer: Self-pay | Admitting: Family Medicine

## 2014-10-25 DIAGNOSIS — I10 Essential (primary) hypertension: Secondary | ICD-10-CM

## 2014-10-25 MED ORDER — HYDROCHLOROTHIAZIDE 12.5 MG PO CAPS: 12.5000 mg | ORAL_CAPSULE | Freq: Every day | ORAL | Status: DC

## 2014-10-25 NOTE — Telephone Encounter (Signed)
Pt called and would like a refill on her Hydrochlorothiazide in 90 day qty. jw

## 2014-12-13 ENCOUNTER — Encounter: Payer: Self-pay | Admitting: Family Medicine

## 2014-12-13 ENCOUNTER — Ambulatory Visit (INDEPENDENT_AMBULATORY_CARE_PROVIDER_SITE_OTHER): Payer: No Typology Code available for payment source | Admitting: Family Medicine

## 2014-12-13 VITALS — BP 132/83 | HR 85 | Temp 99.0°F | Wt 175.8 lb

## 2014-12-13 DIAGNOSIS — N644 Mastodynia: Secondary | ICD-10-CM | POA: Diagnosis not present

## 2014-12-13 DIAGNOSIS — R0789 Other chest pain: Secondary | ICD-10-CM | POA: Diagnosis not present

## 2014-12-13 NOTE — Patient Instructions (Addendum)
It was a pleasure seeing you today, [name].  Information regarding what we discussed is included in this packet.  Please make an appointment to see your PCP if pain persists/ worsens.  Please feel free to call our office at [phone] if any questions or concerns arise.  Warm Regards, Trenda Corliss M. Dajane Valli, DO  Breast Tenderness Breast tenderness is a common problem for women of all ages. Breast tenderness may cause mild discomfort to severe pain. It has a variety of causes. Your health care provider will find out the likely cause of your breast tenderness by examining your breasts, asking you about symptoms, and ordering some tests. Breast tenderness usually does not mean you have breast cancer. HOME CARE INSTRUCTIONS  Breast tenderness often can be handled at home. You can try:  Getting fitted for a new bra that provides more support, especially during exercise.  Wearing a more supportive bra or sports bra while sleeping when your breasts are very tender.  If you have a breast injury, apply ice to the area:  Put ice in a plastic bag.  Place a towel between your skin and the bag.  Leave the ice on for 20 minutes, 2-3 times a day.  If your breasts are too full of milk as a result of breastfeeding, try:  Expressing milk either by hand or with a breast pump.  Applying a warm compress to the breasts for relief.  Taking over-the-counter pain relievers, if approved by your health care provider.  Taking other medicines that your health care provider prescribes. These may include antibiotic medicines or birth control pills. Over the long term, your breast tenderness might be eased if you:  Cut down on caffeine.  Reduce the amount of fat in your diet. Keep a log of the days and times when your breasts are most tender. This will help you and your health care provider find the cause of the tenderness and how to relieve it. Also, learn how to do breast exams at home. This will help you  notice if you have an unusual growth or lump that could cause tenderness. SEEK MEDICAL CARE IF:   Any part of your breast is hard, red, and hot to the touch. This could be a sign of infection.  Fluid is coming out of your nipples (and you are not breastfeeding). Especially watch for blood or pus.  You have a fever as well as breast tenderness.  You have a new or painful lump in your breast that remains after your menstrual period ends.  You have tried to take care of the pain at home, but it has not gone away.  Your breast pain is getting worse, or the pain is making it hard to do the things you usually do during your day. Document Released: 02/15/2008 Document Revised: 11/04/2012 Document Reviewed: 10/01/2012 [facility] Patient Information 2015 [place], [place]. This information is not intended to replace advice given to you by your health care provider. Make sure you discuss any questions you have with your health care provider.

## 2014-12-13 NOTE — Progress Notes (Signed)
    Subjective: CC: breast pain HPI: Patient is a 44 y.o. female presenting to clinic today for same day clinic. Concerns today include:  Breast/ chest wall pain Patient reports that she developed breast pain several months ago.  She about 2 weeks ago realized that this is more of a chest wall pain than breast pain.  Has tried aspirin for pain with some relief.  She reports burning sensation in her chest.  She felt like "peppers in her chest".  No burping or belching.  She avoided spicy foods for this reason.  Has not used any OTC meds.  She reports that the pain is worse with lifting up her L arm.  Return from [place] about 1 week ago.  Pain is described as achy/throbbing in nature.  It is a 0/10 currently.  She had a diagnostic Mammo in 07/2014 that was negative for malignancy.  Not associated with periods.  Does not drink caffeine.  Patient feels like she has been warm at home.  Patient chills, weight loss, nausea, vomiting, injury, diaphoresis, visual disturbance, breast skin changes, bleeding/nipple discharge.  Social History Reviewed: non smoker. FamHx and MedHx updated.  Please see EMR. Health Maintenance: Flu shot due  ROS: All other systems reviewed and are negative.  Objective: Office vital signs reviewed. BP 132/83 mmHg  Pulse 85  Temp(Src) 99 F (37.2 C) (Oral)  Wt 175 lb 12.8 oz (79.742 kg)  LMP 11/29/2014  Physical Examination:  General: Awake, alert, overweight, NAD HEENT: Normal, EOMI Cardio: RRR, S1S2 heard, no murmurs appreciated Pulm: CTAB, no wheezes, rhonchi or rales, normal WOB GI: soft, NT/ND,+BS x4, no hepatomegaly, no splenomegaly Extremities: WWP, No edema, cyanosis or clubbing; +2 pulses bilaterally MSK: Normal gait and station, No axillary LAD, no cervical paraspinal TTP or midline TTP, no bony deformities appreciated, negative Spurling's, Full AROM, 5/5 UE strength in all planes, no sensory deficits, no TTP along chest wall or ribcage, pain in cubital fossa  with abduction/flexion of shoulder.  Last Mammogram reviewed.  Assessment/ Plan: 44 y.o. female with  1. Mastalgia in female.  Chronic.  Reviewed last mammogram.  Patient anxious about discussion of this, as her friend had a breast cancer that was missed on mammo.  Pain does not appear cyclical.  Does not take caffeine.  Reviewed medications and no offending meds could be found.  2. Chest wall pain.  No focal findings on exam.  Very vague complaints making it difficult to pin down a finite dx.  Per patient's request, will obtain CXR though low suspicion that it will be revealing.  MSK vs GERD vs Psychosomatic d/o - Would consider SSRI (cymbalta) if no improvement - DG Chest 2 View; Future - Patient to resume Prilosec - Red flags for MI reviewed. - Patient instructed to follow up in 2 weeks with PCP if no improvement  Janora Norlander, DO PGY-2, [facility]

## 2014-12-16 ENCOUNTER — Other Ambulatory Visit: Payer: Self-pay

## 2014-12-16 DIAGNOSIS — Z1231 Encounter for screening mammogram for malignant neoplasm of breast: Secondary | ICD-10-CM

## 2015-01-10 ENCOUNTER — Ambulatory Visit
Admission: RE | Admit: 2015-01-10 | Discharge: 2015-01-10 | Disposition: A | Discharge status: Home / Self Care | Payer: Managed Care, Other (non HMO) | Source: Ambulatory Visit | Attending: Family Medicine | Admitting: Family Medicine

## 2015-01-10 ENCOUNTER — Ambulatory Visit
Admission: RE | Admit: 2015-01-10 | Discharge: 2015-01-10 | Disposition: A | Discharge status: Home / Self Care | Payer: Managed Care, Other (non HMO) | Source: Ambulatory Visit

## 2015-01-10 DIAGNOSIS — Z1231 Encounter for screening mammogram for malignant neoplasm of breast: Secondary | ICD-10-CM

## 2015-01-10 DIAGNOSIS — R0789 Other chest pain: Secondary | ICD-10-CM

## 2015-08-08 ENCOUNTER — Telehealth: Payer: Self-pay | Admitting: *Deleted

## 2015-08-08 NOTE — Telephone Encounter (Signed)
Printed shot record and last annual exam (from 04/19/2013). Called pt and left a message to inform her they are upfront for pick up and to also try to schedule an annual exam for her. Ottis Stain, CMA

## 2015-08-08 NOTE — Telephone Encounter (Signed)
Patient calling requesting a copy of her most recent physical and her shot record.

## 2015-08-10 ENCOUNTER — Encounter: Payer: Self-pay | Admitting: Family Medicine

## 2015-08-10 ENCOUNTER — Other Ambulatory Visit (HOSPITAL_COMMUNITY)
Admission: RE | Admit: 2015-08-10 | Discharge: 2015-08-10 | Disposition: A | Discharge status: Home / Self Care | Payer: Managed Care, Other (non HMO) | Source: Ambulatory Visit | Attending: Family Medicine | Admitting: Family Medicine

## 2015-08-10 ENCOUNTER — Ambulatory Visit (INDEPENDENT_AMBULATORY_CARE_PROVIDER_SITE_OTHER): Payer: Managed Care, Other (non HMO) | Admitting: Family Medicine

## 2015-08-10 VITALS — BP 120/84 | HR 76 | Temp 98.6°F | Wt 176.8 lb

## 2015-08-10 DIAGNOSIS — Z124 Encounter for screening for malignant neoplasm of cervix: Secondary | ICD-10-CM

## 2015-08-10 DIAGNOSIS — Z01419 Encounter for gynecological examination (general) (routine) without abnormal findings: Secondary | ICD-10-CM | POA: Diagnosis present

## 2015-08-10 DIAGNOSIS — I10 Essential (primary) hypertension: Secondary | ICD-10-CM

## 2015-08-10 DIAGNOSIS — Z1151 Encounter for screening for human papillomavirus (HPV): Secondary | ICD-10-CM | POA: Insufficient documentation

## 2015-08-10 DIAGNOSIS — Z0000 Encounter for general adult medical examination without abnormal findings: Secondary | ICD-10-CM

## 2015-08-10 DIAGNOSIS — R7303 Prediabetes: Secondary | ICD-10-CM | POA: Diagnosis not present

## 2015-08-10 DIAGNOSIS — Z114 Encounter for screening for human immunodeficiency virus [HIV]: Secondary | ICD-10-CM

## 2015-08-10 NOTE — Patient Instructions (Addendum)
Thank you for coming in,   Please schedule a lab visit to have your labs drawn in the future.   Please bring all of your medications with you to each visit.   Health maintenance items that are due.  Health Maintenance  Topic Date Due  . HIV Screening  03/30/1983  . Pap Smear  02/05/2015  . Flu Shot  10/17/2015  . Tetanus Vaccine  01/06/2017     Sign up for My Chart to have easy access to your labs results, and communication with your Primary care physician   Please feel free to call with any questions or concerns at any time, at [phone]. --Dr. Raeford Razor

## 2015-08-10 NOTE — Progress Notes (Signed)
   Subjective:    [name] - 45 y.o. female MRN [id number]  Date of birth: [birth year]  CC annual exam   HPI  [name] is here for annual exam.  Cardiovascular: - Dx Hypertension: yes  - Dx Hyperlipidemia: no  - Dx Obesity: yes  - Physical Activity: nothing formal.   - Diabetes: no  Cancer: Colorectal >> Colonoscopy: no  Lung >> Tobacco Use: no  Breast >> Mammogram: normal last year  Cervical/Endometrial >>  - Postmenopausal: no  - Vaginal Bleeding: last cycle was August of 2017 - Pap Smear: yes   - Previous Abnormal Pap: no Skin >> Suspicious lesions: no   Social: Alcohol Use: no  Tobacco Use: no  Other Drugs: no  Risky Sexual Behavior: no  Depression: no  Support and Life at Home: yes   Health Maintenance:  - pap smear today  - hiv order placed   Health Maintenance Due  Topic Date Due  . HIV Screening  03/30/1983  . PAP SMEAR  02/05/2015    Review of Systems See HPI     Objective:   Physical Exam BP 120/84 mmHg  Pulse 76  Temp(Src) 98.6 F (37 C) (Oral)  Wt 176 lb 12.8 oz (80.196 kg)  LMP 11/09/2014 (Approximate) Gen: NAD, alert, cooperative with exam,  HEENT: NCAT, PERRL, clear conjunctiva, oropharynx clear, supple neck, no cervical LAD, uvula midline, clear conjunctiva,  CV: RRR, good S1/S2, no murmur, no edema,   Resp: CTABL, no wheezes, non-labored Abd: SNTND, BS present, no guarding or organomegaly Skin: pedunculated skin tag in her right lateral perineum  Neuro: no gross deficits.  Psych:  alert and oriented [id number]: skin tag on her  Vagina: no blood in vault Cervix: no lesion; no mucopurulent d/c    Assessment & Plan:   HYPERTENSION, BENIGN ESSENTIAL Controlled - continue current medication  Annual physical exam Has limited exercise.  Lab work placed for future Pap smear completed  Skin tag could be excise in the future, may need referral to Gyn.  - f/u in one year.

## 2015-08-10 NOTE — Telephone Encounter (Signed)
Checked up front and pt has already picked this up. [name], [name], [place]

## 2015-08-11 DIAGNOSIS — Z7184 Encounter for health counseling related to travel: Secondary | ICD-10-CM | POA: Insufficient documentation

## 2015-08-11 DIAGNOSIS — Z0000 Encounter for general adult medical examination without abnormal findings: Secondary | ICD-10-CM | POA: Insufficient documentation

## 2015-08-11 NOTE — Assessment & Plan Note (Signed)
Controlled  - continue current medication 

## 2015-08-11 NOTE — Assessment & Plan Note (Signed)
Has limited exercise.  Lab work placed for future Pap smear completed  Skin tag could be excise in the future, may need referral to Gyn.  - f/u in one year.

## 2015-08-15 LAB — CYTOLOGY - PAP

## 2015-08-18 ENCOUNTER — Telehealth: Payer: Self-pay | Admitting: Family Medicine

## 2015-08-18 NOTE — Telephone Encounter (Signed)
Spoke with patient about her pap smear. She will come in for a lab visit.   Rosemarie Ax, MD PGY-3, [facility] 08/18/2015, 8:35 AM

## 2015-09-07 ENCOUNTER — Ambulatory Visit (INDEPENDENT_AMBULATORY_CARE_PROVIDER_SITE_OTHER): Payer: Managed Care, Other (non HMO) | Admitting: Family Medicine

## 2015-09-07 ENCOUNTER — Other Ambulatory Visit: Payer: Managed Care, Other (non HMO)

## 2015-09-07 VITALS — BP 123/71 | HR 86 | Temp 98.3°F | Ht 63.0 in | Wt 175.4 lb

## 2015-09-07 DIAGNOSIS — R5381 Other malaise: Secondary | ICD-10-CM | POA: Insufficient documentation

## 2015-09-07 DIAGNOSIS — I10 Essential (primary) hypertension: Secondary | ICD-10-CM

## 2015-09-07 LAB — CBC
HEMATOCRIT: 35.8 % (ref 35.0–45.0)
HEMOGLOBIN: 11.7 g/dL (ref 11.7–15.5)
MCH: 27.9 pg (ref 27.0–33.0)
MCHC: 32.7 g/dL (ref 32.0–36.0)
MCV: 85.4 fL (ref 80.0–100.0)
MPV: 11.6 fL (ref 7.5–12.5)
Platelets: 252 10*3/uL (ref 140–400)
RBC: 4.19 MIL/uL (ref 3.80–5.10)
RDW: 14.2 % (ref 11.0–15.0)
WBC: 12.9 10*3/uL — ABNORMAL HIGH (ref 3.8–10.8)

## 2015-09-07 LAB — BASIC METABOLIC PANEL WITH GFR
BUN: 11 mg/dL (ref 7–25)
CALCIUM: 8.8 mg/dL (ref 8.6–10.2)
CHLORIDE: 105 mmol/L (ref 98–110)
CO2: 28 mmol/L (ref 20–31)
CREATININE: 0.9 mg/dL (ref 0.50–1.10)
GFR, Est African American: 88 mL/min (ref >=60)
GFR, Est Non African American: 76 mL/min (ref >=60)
GLUCOSE: 108 mg/dL — AB (ref 65–99)
Potassium: 3.5 mmol/L (ref 3.5–5.3)
Sodium: 141 mmol/L (ref 135–146)

## 2015-09-07 LAB — LIPID PANEL
Cholesterol: 124 mg/dL — ABNORMAL LOW (ref 125–200)
HDL: 51 mg/dL (ref >=46)
LDL CALC: 61 mg/dL (ref <130)
Total CHOL/HDL Ratio: 2.4 Ratio (ref <=5.0)
Triglycerides: 58 mg/dL (ref <150)
VLDL: 12 mg/dL (ref <30)

## 2015-09-07 MED ORDER — PENICILLIN V POTASSIUM 500 MG PO TABS: 500.0000 mg | ORAL_TABLET | Freq: Three times a day (TID) | ORAL | Status: DC

## 2015-09-07 NOTE — Progress Notes (Signed)
   Subjective:    [name] - 45 y.o. female MRN [id number]  Date of birth: Sep 17, 1970  CC malaise   HPI  [name] is here for malaise.   Symptoms started 2 days ago.  Having some scratchy.  Fever 102 last night.  She took ibuprofen at midnight.  Took tylenol at 3:30.  Had some diaphoresis last night.  Has sick contacts at work.  No diarrhea or vomiting.  Having some muscle aches.  Yesterday her right calf was painful and had a hard time walking on it.  No history of blood clots. . Symptoms are staying same.  Symptoms are worse at night.  No shortness of breath or chest pain.  No rashes, tick bites or bug bites.  No dysuria.   PMH: HTN,  SH: denies alcohol or tobacco  FH: no family history of blood clots, mother with CVA at 22  Health Maintenance:  Health Maintenance Due  Topic Date Due  . HIV Screening  03/30/1983    Review of Systems See HPI     Objective:   Physical Exam BP 123/71 mmHg  Pulse 86  Temp(Src) 98.3 F (36.8 C) (Oral)  Ht 5\' 3"  (1.6 m)  Wt 175 lb 6.4 oz (79.561 kg)  BMI 31.08 kg/m2  LMP 11/09/2014 (Approximate) Gen: NAD, alert, cooperative with exam,  HEENT: NCAT, EOMI, TM clear and intact bilaterally, clear conjunctiva, no LAD, enlarged tonsils b/l, some tonsillar exudates present, supple neck CV: RRR, good S1/S2, no murmur, no edema,   Resp: CTABL, no wheezes, non-labored Skin: no rashes, normal turgor   Assessment & Plan:   Malaise Most likely she has strep pharyngitis.  Centor of 2  Exam suggestive of infection  Pen VK 500 TID for 10 days  Given indications if worsening or fails therapy  Counseled if she develops a rash.

## 2015-09-07 NOTE — Assessment & Plan Note (Signed)
Most likely she has strep pharyngitis.  Centor of 2  Exam suggestive of infection  Pen VK 500 TID for 10 days  Given indications if worsening or fails therapy  Counseled if she develops a rash.

## 2015-09-07 NOTE — Patient Instructions (Signed)
Thank you for coming in,   Please continue ibuprofen and tylenol for fever.   Please complete the whole course of antibiotics.   Please follow up if your symptoms worsen or fail to improve.  Please bring all of your medications with you to each visit.   Health maintenance items that are due.  Health Maintenance  Topic Date Due  . HIV Screening  03/30/1983  . Flu Shot  10/17/2015  . Tetanus Vaccine  01/06/2017  . Pap Smear  08/10/2018     Sign up for My Chart to have easy access to your labs results, and communication with your Primary care physician   Please feel free to call with any questions or concerns at any time, at [phone]. --Dr. Raeford Razor

## 2015-09-08 ENCOUNTER — Encounter: Payer: Self-pay | Admitting: Family Medicine

## 2015-09-18 ENCOUNTER — Telehealth: Payer: Self-pay | Admitting: *Deleted

## 2015-09-18 NOTE — Telephone Encounter (Signed)
Called patient back, no answer. Left voicemail.

## 2015-09-18 NOTE — Telephone Encounter (Signed)
Pt calling to speak with dr because she has questions about her lab results. Please advise. Deseree Kennon Holter, CMA

## 2015-10-02 NOTE — Telephone Encounter (Signed)
Patient called back regarding lab results.  She has some concern regarding her CBC results.  Will forward to MD to advise. Jazmin Hartsell,CMA

## 2015-10-02 NOTE — Telephone Encounter (Addendum)
Called patient back with both numbers that are in [place]. Patient's daughter answered and said her mother wasn't there. Will try calling back again tomorrow.

## 2015-10-03 NOTE — Telephone Encounter (Signed)
Called patient back. Discussed last blood work. Patient was concerned about her elevated WBC. Discussed with patient that infection can cause that. She is feeling much better now. Patient appreciative of the phone call.

## 2016-05-22 ENCOUNTER — Other Ambulatory Visit: Payer: Self-pay | Admitting: *Deleted

## 2016-05-22 ENCOUNTER — Telehealth: Payer: Self-pay | Admitting: Family Medicine

## 2016-05-22 DIAGNOSIS — I10 Essential (primary) hypertension: Secondary | ICD-10-CM

## 2016-05-22 NOTE — Telephone Encounter (Signed)
Needs refill on HCTZ   [employer]

## 2016-05-23 MED ORDER — HYDROCHLOROTHIAZIDE 12.5 MG PO CAPS: 12.5000 mg | ORAL_CAPSULE | Freq: Every day | ORAL | 0 refills | Status: DC

## 2016-09-25 NOTE — Progress Notes (Deleted)
Subjective:  [name] is a 46 y.o. year old female who presents to office today for an annual physical examination.  Concerns today include:  1. Hypertension Blood pressure at home: *** Exercise: *** Low salt diet: *** Medications: Compliant with *** Side effects: *** ROS: Denies headache, dizziness, visual changes, nausea, vomiting, chest pain, abdominal pain or shortness of breath. BP Readings from Last 3 Encounters:  09/07/15 123/71  08/10/15 120/84  12/13/14 132/83    2.   Review of Systems {ros; [id number]}   Review of Systems: Per HPI. All other systems reviewed and are negative.  General Healthcare: Medication Compliance: *** Dx Hypertension: *** Dx Hyperlipidemia: *** Diabetes: *** Dx Obesity: *** Weight Loss: *** Physical Activity: *** Urinary Incontinence: ***  Menstrual hx: *** Last dental exam: ***  Social:  reports that she has never smoked. She does not have any smokeless tobacco history on file. Driving: *** Alcohol Use: *** Tobacco ***  Other Drugs: ***  Support and Life at Home: *** Advanced Directives: *** Work: ***  Cancer:  Colorectal >> Colonoscopy: *** Lung >> Tobacco Use: ***              - If so, previous Low-Dose CT screen: *** Breast >> Mammogram: *** Cervical/Endometrial >>  - Postmenopausal: *** - Hysterectomy: *** - Vaginal Bleeding: *** Skin >> Suspicious lesions: ***  Other: Osteoporosis: *** TDAP: *** every 82yrs - (<3 lifetime doses or unknown): all wounds -- look up need for Tetanus IG - (>=3 lifetime doses): clean/minor wound if >68yrs from previous; all other wounds if >56yrs from previous Zoster Vaccine: *** (those >50yo, once) Pneumonia Vaccine: *** (those w/ risk factors) - (<25yr) Both: Immunocompromised, cochlear implant, CSF leak, asplenic, sickle cell, Chronic Renal Failure - (<41yr) PPSV-23 only: Heart dz, lung disease, DM, tobacco abuse, alcoholism, cirrhosis/liver disease. - (>51yr):  PPSV13 then PPSV23 in 6-12mths;  - (>75yr): repeat PPSV23 once if pt received prior to 48yo and 35yrs have passed   Health Maintenance Due  Topic Date Due  . HIV Screening  03/30/1983    Past Medical History Past Medical History:  Diagnosis Date  . Allergy   . Anemia   . Hypertension   . Uterine fibroid    Patient Active Problem List   Diagnosis Date Noted  . Malaise 09/07/2015  . Annual physical exam 08/11/2015  . Breast pain 06/25/2014  . Arm pain 06/25/2014  . Blurred vision, bilateral 05/25/2014  . Abnormal uterine bleeding 02/05/2012  . HYPERTENSION, BENIGN ESSENTIAL 01/07/2007  . UTERINE FIBROID 05/15/2006  . Anemia, iron deficiency 05/15/2006    Medications- reviewed and updated Current Outpatient Prescriptions  Medication Sig Dispense Refill  . cyclobenzaprine (FLEXERIL) 10 MG tablet Take 1 tablet (10 mg total) by mouth 2 (two) times daily as needed for muscle spasms. 20 tablet 0  . ferrous sulfate 325 (65 FE) MG tablet Take 325 mg by mouth 2 (two) times daily.      . hydrochlorothiazide (MICROZIDE) 12.5 MG capsule Take 1 capsule (12.5 mg total) by mouth daily. 90 capsule 0  . ibuprofen (ADVIL,MOTRIN) 200 MG tablet Take 800 mg by mouth every 6 (six) hours as needed for pain.    Marland Kitchen ibuprofen (ADVIL,MOTRIN) 800 MG tablet Take 1 tablet (800 mg total) by mouth 3 (three) times daily. 21 tablet 0  . medroxyPROGESTERone (PROVERA) 10 MG tablet Take 1 tablet (10 mg total) by mouth daily. 10 tablet 0  . omeprazole (PRILOSEC) 20 MG capsule Take 20 mg  by mouth daily.    . penicillin v potassium (VEETID) 500 MG tablet Take 1 tablet (500 mg total) by mouth 3 (three) times daily. For a total of 10 days. 30 tablet 0   No current facility-administered medications for this visit.     Objective: There were no vitals taken for this visit. Gen: In no acute distress, alert, cooperative with exam, well groomed HEENT: NCAT, EOMI, PERRL CV: Regular rate and rhythm, normal S1/S2, no  murmur Resp: Clear to auscultation bilaterally, no wheezes, non-labored Abd: Soft, Non Tender, Non Distended, bowel sounds present, no guarding or organomegaly Ext: No edema, warm and well perfused Neuro: Alert and oriented, No gross deficits, normal gait Psych: Normal mood and affect   Assessment/Plan:  No problem-specific Assessment & Plan notes found for this encounter.   No orders of the defined types were placed in this encounter.   No orders of the defined types were placed in this encounter.    Smitty Cords, MD [place], PGY-3

## 2016-09-26 ENCOUNTER — Encounter: Payer: Managed Care, Other (non HMO) | Admitting: Family Medicine

## 2016-10-03 ENCOUNTER — Ambulatory Visit (INDEPENDENT_AMBULATORY_CARE_PROVIDER_SITE_OTHER): Payer: Commercial Managed Care - PPO | Admitting: Internal Medicine

## 2016-10-03 VITALS — BP 122/80 | HR 63 | Temp 98.6°F | Ht 63.0 in | Wt 171.8 lb

## 2016-10-03 DIAGNOSIS — Z6830 Body mass index (BMI) 30.0-30.9, adult: Secondary | ICD-10-CM

## 2016-10-03 DIAGNOSIS — E669 Obesity, unspecified: Secondary | ICD-10-CM

## 2016-10-03 DIAGNOSIS — R6889 Other general symptoms and signs: Secondary | ICD-10-CM

## 2016-10-03 DIAGNOSIS — R7303 Prediabetes: Secondary | ICD-10-CM | POA: Diagnosis not present

## 2016-10-03 DIAGNOSIS — H538 Other visual disturbances: Secondary | ICD-10-CM

## 2016-10-03 LAB — POCT GLYCOSYLATED HEMOGLOBIN (HGB A1C): Hemoglobin A1C: 5.8

## 2016-10-03 NOTE — Patient Instructions (Addendum)
Please follow up with your PCP, Dr. Juanito Doom soon for a physical   Please follow up with your eye doctor as soon as possible.

## 2016-10-03 NOTE — Assessment & Plan Note (Addendum)
Blurred vision only with eating sweet foods. A1c today 5.8 from 6. Recommended continued dietary management. Encourage patient to exercise at least 3 times a week and to follow up with eye doctor. Unclear in etiology. There are no red flags on history or exam. Recommended avoiding sweet foods. Return precautions/red flags discussed.

## 2016-10-03 NOTE — Progress Notes (Signed)
   [facility] Phone: [phone]   Date of Visit: 10/03/2016   HPI:  Blurred Vision:  Patient is here for same day visit for intermittent blurred vision.  - reports that she has bilateral blurred vision any time she something sugary to eat like juice. It does not occur at other times. She denies any pain or eye redness. She denies Headache. She denies intermittent slurred speech, focal weakness, or paresthesias. No eye discharge. The blurred vision lasts about 30-45 minutes. No dizziness.  - she reports of symptoms for 1 months to 1 year. Per chart review, she did mention this to prior PCP  - she is worried that her prediabetes may have worsened and is causing this. Her last A1c was 6 in 05/2014. She denies polyuria, polydipsia, polyphagia. She is watching her carbohydrate intake. She is not exercising.  - she saw her eye doctor 2 years ago. She was told that she needed glasses for reading fine print. She has reading glasses. She was also given Restasis.    ROS: See HPI.  [place]:  HTN Fibroid, Uterine  PHYSICAL EXAM: BP 122/80 (BP Location: Left Arm, Patient Position: Sitting, Cuff Size: Normal)   Pulse 63   Temp 98.6 F (37 C) (Oral)   Ht 5\' 3"  (1.6 m)   Wt 171 lb 12.8 oz (77.9 kg)   SpO2 99%   BMI 30.43 kg/m  GEN: NAD HEENT: Atraumatic, normocephalic, neck supple without lymphadenopathy, EOMI, sclera clear, PERRL. No tenderness to palpation of temples or scalp CV: RRR, no murmurs, rubs, or gallops PULM: CTAB, normal effort PSYCH: Mood and affect euthymic, normal rate and volume of speech NEURO: Awake, alert, no focal deficits noted, normal speech  Visual Acuity: 20/20 in both eyes, 20/25 R, 20/30 L.   ASSESSMENT/PLAN:  Health maintenance:  - lipid panel today   Blurred vision, bilateral Blurred vision only with eating sweet foods. A1c today 5.8 from 6. Recommended continued dietary management. Encourage patient to exercise at least 3 times a  week and to follow up with eye doctor. Unclear in etiology. There are no red flags on history or exam. Recommended avoiding sweet foods. Return precautions/red flags discussed.   Smiley Houseman, MD PGY [place]

## 2016-10-04 ENCOUNTER — Encounter: Payer: Self-pay | Admitting: Internal Medicine

## 2016-10-04 LAB — LIPID PANEL
CHOL/HDL RATIO: 2.7 ratio (ref 0.0–4.4)
CHOLESTEROL TOTAL: 133 mg/dL (ref 100–199)
HDL: 49 mg/dL (ref >39)
LDL Calculated: 71 mg/dL (ref 0–99)
TRIGLYCERIDES: 65 mg/dL (ref 0–149)
VLDL Cholesterol Cal: 13 mg/dL (ref 5–40)

## 2016-10-04 NOTE — Progress Notes (Signed)
Letter sent regarding normal labs.

## 2016-10-09 ENCOUNTER — Ambulatory Visit (INDEPENDENT_AMBULATORY_CARE_PROVIDER_SITE_OTHER): Payer: Commercial Managed Care - PPO | Admitting: Family Medicine

## 2016-10-09 ENCOUNTER — Encounter: Payer: Self-pay | Admitting: Family Medicine

## 2016-10-09 VITALS — BP 118/80 | HR 71 | Temp 98.7°F | Ht 63.0 in | Wt 171.0 lb

## 2016-10-09 DIAGNOSIS — Z114 Encounter for screening for human immunodeficiency virus [HIV]: Secondary | ICD-10-CM | POA: Diagnosis not present

## 2016-10-09 DIAGNOSIS — M79602 Pain in left arm: Secondary | ICD-10-CM

## 2016-10-09 DIAGNOSIS — H538 Other visual disturbances: Secondary | ICD-10-CM | POA: Diagnosis not present

## 2016-10-09 DIAGNOSIS — Z0000 Encounter for general adult medical examination without abnormal findings: Secondary | ICD-10-CM | POA: Diagnosis not present

## 2016-10-09 DIAGNOSIS — L918 Other hypertrophic disorders of the skin: Secondary | ICD-10-CM | POA: Insufficient documentation

## 2016-10-09 DIAGNOSIS — I10 Essential (primary) hypertension: Secondary | ICD-10-CM | POA: Diagnosis not present

## 2016-10-09 NOTE — Assessment & Plan Note (Addendum)
~  1 cm pedunculated skin tag in right groin lateral to right labia majora, does not look infected or irritated. It is bothering patient and she wants it removed. -Patient will make appointment at her dermatology clinic for removal and biopsy

## 2016-10-09 NOTE — Assessment & Plan Note (Signed)
Patient doing well overall. -She is due for her mammogram, advised for her to get this -Up-to-date on her Pap -All vaccinations up-to-date -Advised that she will need a colonoscopy in 2 years -Obtained CBC, CMP, and TSH today -Follow-up in 1 year for next annual physical exam

## 2016-10-09 NOTE — Assessment & Plan Note (Signed)
Unclear etiology. Not present on exam today but does happen intermittently per patient report. No signs of shoulder or cervical pain. Could be related to neuropathy versus anemia versus dehydration. -We will obtain CBC today -May consider cervical x-rays and/or gabapentin in the future

## 2016-10-09 NOTE — Assessment & Plan Note (Signed)
Controlled and at goal. -Continue hydrochlorothiazide 12.5 mg daily

## 2016-10-09 NOTE — Progress Notes (Signed)
Subjective:  [name] is a 46 y.o. year old female who presents to office today for an annual physical examination.  Concerns today include:  1. Hypertension Blood pressure at home: checks occasionally at home when she feels like it may be high, states its usually 110s/70s Exercise: Walks a couple times a week Low salt diet: Compliant Medications: Compliant with hydrochlorothiazide Side effects: None ROS: Denies headache, dizziness, visual changes, nausea, vomiting, chest pain, abdominal pain or shortness of breath. BP Readings from Last 3 Encounters:  10/09/16 118/80  10/03/16 122/80  09/07/15 123/71   2. Skin lesion near vagina: Patient notes that she has had a lesion near the right side of her vagina for about 10 years. She thinks that it may have gotten bigger but can't tell exactly. States that it is not painful but sometimes rubs against her underwear. Has not noticed any drainage, erythema, or inflammation in the area.  3. Heaviness in left arm: Patient notes that for the last year or so she has had a "heavy" feeling in her left arm. She denies any real pain in the arm but notes that it is uncomfortable. She notes that this heavy feeling is intermittent and comes and goes throughout the week. When the heaviness does start and will last for 30 minutes at a time. Denies any shoulder or neck pain. Denies any arm trauma.  4. Blurriness in eyes: Patient notes that whenever she eats sweet foods she has blurriness in her eyes. She was seen for this about a week ago.   General Healthcare: Medication Compliance: yes compliant with hydrochlorothiazide but does not always take iron pills  Dx Hypertension: Yes Dx Hyperlipidemia: No Diabetes: no but does have prediabetes  Dx Obesity: no  Weight Loss: no Physical Activity: Walks, is a nurse Urinary Incontinence: None Menstrual hx: LMP was a 8 months  Last dental exam: Last exam was in May states she was supposed to follow  up  Social:  reports that she has never smoked. She has never used smokeless tobacco. Driving: Drives a car, wears seatbelt  Alcohol Use:  once or twice a month  Tobacco no   Other DrugsNoSupport and Life at Home: Lives with husband and 3 children (35, 9, and 43 yo) in [place]: No  Work: Works as Marine scientist in [place]:  Colorectal >> Colonoscopy: not due until she is 37  Lung >> Tobacco Use:  not applicable   Breast >> Mammogram: overdue for exam, went to 2 years ago and it was normal  Cervical/Endometrial >>  - Postmenopausal: yes - Hysterectomy: no  - Vaginal Bleeding: none Skin >> Suspicious lesions: yes has a lesion near her vagina   Other: TDAP: Up-to-date  Health Maintenance Due  Topic Date Due  . HIV Screening  03/30/1983    Past Medical History Past Medical History:  Diagnosis Date  . Allergy   . Anemia   . Hypertension   . Uterine fibroid    Patient Active Problem List   Diagnosis Date Noted  . Skin tag 10/09/2016  . Malaise 09/07/2015  . Annual physical exam 08/11/2015  . Breast pain 06/25/2014  . Arm pain 06/25/2014  . Blurred vision, bilateral 05/25/2014  . Abnormal uterine bleeding 02/05/2012  . HYPERTENSION, BENIGN ESSENTIAL 01/07/2007  . UTERINE FIBROID 05/15/2006  . Anemia, iron deficiency 05/15/2006    Medications- reviewed and updated Current Outpatient Prescriptions  Medication Sig Dispense Refill  . ferrous sulfate 325 (65  FE) MG tablet Take 325 mg by mouth 2 (two) times daily.      . hydrochlorothiazide (MICROZIDE) 12.5 MG capsule Take 1 capsule (12.5 mg total) by mouth daily. 90 capsule 0  . ibuprofen (ADVIL,MOTRIN) 200 MG tablet Take 800 mg by mouth every 6 (six) hours as needed for pain.     No current facility-administered medications for this visit.     Objective: BP 118/80   Pulse 71   Temp 98.7 F (37.1 C) (Oral)   Ht 5\' 3"  (1.6 m)   Wt 171 lb (77.6 kg)   SpO2 99%   BMI 30.29 kg/m   Gen: In no acute distress, alert, cooperative with exam, well groomed HEENT: NCAT, EOMI, PERRL CV: Regular rate and rhythm, normal S1/S2, no murmur Resp: Clear to auscultation bilaterally, no wheezes, non-labored Abd: Soft, Non Tender, Non Distended, bowel sounds present, no guarding or organomegaly Ext: No edema, warm and well perfused Neuro: Alert and oriented, No gross deficits, normal gait Skin: Pedunculated flesh-colored 1 cm hanging skin tag in right groin near labia majora, non-erythematous  Psych: Normal mood and affect  Assessment/Plan:  HYPERTENSION, BENIGN ESSENTIAL Controlled and at goal. -Continue hydrochlorothiazide 12.5 mg daily  Blurred vision, bilateral Associated with eating sweet foods. Patient's last A1c was 5.8 indicating she has prediabetes, less likely that this is causing her vision changes. Her vision was normal when seen one week ago. -Recommended patient follow-up with her eye doctor -Will continue to decrease carbohydrates and sweets from diet  Arm pain Unclear etiology. Not present on exam today but does happen intermittently per patient report. No signs of shoulder or cervical pain. Could be related to neuropathy versus anemia versus dehydration. -We will obtain CBC today -May consider cervical x-rays and/or gabapentin in the future  Annual physical exam Patient doing well overall. -She is due for her mammogram, advised for her to get this -Up-to-date on her Pap -All vaccinations up-to-date -Advised that she will need a colonoscopy in 2 years -Obtained CBC, CMP, and TSH today -Follow-up in 1 year for next annual physical exam  Skin tag ~1 cm pedunculated skin tag in right groin lateral to right labia majora, does not look infected or irritated. It is bothering patient and she wants it removed. -Patient will make appointment at her dermatology clinic for removal and biopsy   Orders Placed This Encounter  Procedures  . CBC with Differential  .  Comprehensive metabolic panel    Order Specific Question:   Has the patient fasted?    Answer:   No  . HIV antibody  . TSH    Smitty Cords, MD [place], PGY-3

## 2016-10-09 NOTE — Patient Instructions (Signed)
Thank you for coming in today, it was so nice to see you! Today we talked about:    Skin tag near vagina: You can schedule an appointment with our dermatology clinic at your earliest convenience  Heaviness in right arm: this may be coming from your anemia, there are no problems with your shoulder or neck.   Blurriness: Please see your eye doctor  Please get a mammogram  Please follow up in 1 year for your next annual physical.   Bring in all your medications or supplements to each appointment for review.   If we ordered any tests today, you will be notified via telephone of any abnormalities. If everything is normal you will get a letter in the mail.   If you have any questions or concerns, please do not hesitate to call the office at [phone]. You can also message me directly via MyChart.   Sincerely,  Smitty Cords, MD

## 2016-10-09 NOTE — Assessment & Plan Note (Signed)
Associated with eating sweet foods. Patient's last A1c was 5.8 indicating she has prediabetes, less likely that this is causing her vision changes. Her vision was normal when seen one week ago. -Recommended patient follow-up with her eye doctor -Will continue to decrease carbohydrates and sweets from diet

## 2016-10-10 ENCOUNTER — Encounter: Payer: Self-pay | Admitting: Family Medicine

## 2016-10-10 LAB — CBC WITH DIFFERENTIAL/PLATELET
BASOS: 0 %
Basophils Absolute: 0 10*3/uL (ref 0.0–0.2)
EOS (ABSOLUTE): 0.1 10*3/uL (ref 0.0–0.4)
Eos: 2 %
HEMATOCRIT: 33.8 % — AB (ref 34.0–46.6)
Hemoglobin: 11 g/dL — ABNORMAL LOW (ref 11.1–15.9)
IMMATURE GRANULOCYTES: 0 %
Immature Grans (Abs): 0 10*3/uL (ref 0.0–0.1)
LYMPHS ABS: 2.5 10*3/uL (ref 0.7–3.1)
Lymphs: 53 %
MCH: 27.8 pg (ref 26.6–33.0)
MCHC: 32.5 g/dL (ref 31.5–35.7)
MCV: 85 fL (ref 79–97)
MONOS ABS: 0.3 10*3/uL (ref 0.1–0.9)
Monocytes: 7 %
NEUTROS PCT: 38 %
Neutrophils Absolute: 1.8 10*3/uL (ref 1.4–7.0)
PLATELETS: 269 10*3/uL (ref 150–379)
RBC: 3.96 x10E6/uL (ref 3.77–5.28)
RDW: 14.3 % (ref 12.3–15.4)
WBC: 4.7 10*3/uL (ref 3.4–10.8)

## 2016-10-10 LAB — COMPREHENSIVE METABOLIC PANEL
ALT: 15 IU/L (ref 0–32)
AST: 16 IU/L (ref 0–40)
Albumin/Globulin Ratio: 1.3 (ref 1.2–2.2)
Albumin: 4.2 g/dL (ref 3.5–5.5)
Alkaline Phosphatase: 75 IU/L (ref 39–117)
BUN/Creatinine Ratio: 12 (ref 9–23)
BUN: 10 mg/dL (ref 6–24)
Bilirubin Total: 0.5 mg/dL (ref 0.0–1.2)
CALCIUM: 9.5 mg/dL (ref 8.7–10.2)
CO2: 26 mmol/L (ref 20–29)
CREATININE: 0.81 mg/dL (ref 0.57–1.00)
Chloride: 104 mmol/L (ref 96–106)
GFR calc Af Amer: 99 mL/min/{1.73_m2} (ref >59)
GFR, EST NON AFRICAN AMERICAN: 86 mL/min/{1.73_m2} (ref >59)
Globulin, Total: 3.3 g/dL (ref 1.5–4.5)
Glucose: 92 mg/dL (ref 65–99)
Potassium: 4 mmol/L (ref 3.5–5.2)
Sodium: 141 mmol/L (ref 134–144)
Total Protein: 7.5 g/dL (ref 6.0–8.5)

## 2016-10-10 LAB — HIV ANTIBODY (ROUTINE TESTING W REFLEX): HIV Screen 4th Generation wRfx: NONREACTIVE

## 2016-10-10 LAB — TSH: TSH: 1.91 u[IU]/mL (ref 0.450–4.500)

## 2016-12-04 ENCOUNTER — Telehealth: Payer: Self-pay | Admitting: Family Medicine

## 2016-12-04 NOTE — Telephone Encounter (Signed)
Work proof of physical  form dropped off for at front desk for completion.  Verified that patient section of form has been completed.  Last DOS/WCC with PCP was 10/09/16.  Placed form in white team folder to be completed by clinical staff.  Carmina Miller

## 2016-12-06 NOTE — Telephone Encounter (Signed)
Clinical info completed on Physical Verification form.  Place form in Dr. Thad Ranger box for completion.  [name], Alynah Schone D, [place]

## 2016-12-09 NOTE — Telephone Encounter (Signed)
Patient advised that form was complete and faxed per request.  Original copy placed up front for pickup.  Derl Barrow, RN

## 2016-12-09 NOTE — Telephone Encounter (Signed)
Form filled out and placed in Tamika's box.   Smitty Cords, MD [place], PGY-3

## 2017-01-16 ENCOUNTER — Ambulatory Visit: Payer: Commercial Managed Care - PPO

## 2017-02-27 ENCOUNTER — Other Ambulatory Visit: Payer: Self-pay

## 2017-02-27 ENCOUNTER — Ambulatory Visit (INDEPENDENT_AMBULATORY_CARE_PROVIDER_SITE_OTHER): Payer: Commercial Managed Care - PPO | Admitting: Family Medicine

## 2017-02-27 VITALS — BP 110/64 | HR 60 | Temp 98.3°F | Ht 63.0 in | Wt 170.0 lb

## 2017-02-27 DIAGNOSIS — N9089 Other specified noninflammatory disorders of vulva and perineum: Secondary | ICD-10-CM

## 2017-02-27 NOTE — Progress Notes (Signed)
  Subjective:    [name] is a 46 y.o. old female here for skin tag removal  HPI Skin tag: Over her perineal area, to the right of her vagina.  This has been there for 5-6 years.  No change in size.  Denies pain or pruritus.  Has not tried any medication.  She is here today to have it removed because it is embarrassing to her.  She is not on blood thinners. PMH/Problem List: has UTERINE FIBROID; Anemia, iron deficiency; HYPERTENSION, BENIGN ESSENTIAL; Abnormal uterine bleeding; Blurred vision, bilateral; Breast pain; Arm pain; Annual physical exam; Malaise; and Skin tag on their problem list.   has a past medical history of Allergy, Anemia, Hypertension, and Uterine fibroid.  FH:  History reviewed. No pertinent family history.  Is a 56  SH Social History   Tobacco Use  . Smoking status: Never Smoker  . Smokeless tobacco: Never Used  Substance Use Topics  . Alcohol use: No  . Drug use: No    Review of Systems Review of systems negative except for pertinent positives and negatives in history of present illness above.     Objective:     Vitals:   02/27/17 1351  BP: 110/64  Pulse: 60  Temp: 98.3 F (36.8 C)  TempSrc: Oral  SpO2: 99%  Weight: 170 lb (77.1 kg)  Height: 5\' 3"  (1.6 m)   Body mass index is 30.11 kg/m.    Physical Exam  GEN: appears well, no apparent distress RESP: no IWOB SKIN: Pedunculated skin tag to the right of her vagina.  About a pea-sized.  Soft to palpation.  Not painful.  No surrounding skin lesion.    Assessment and Plan:  1. Skin tag of female perineum: Pedunculated skin tag to the right of her vagina.  About a pea-sized.  Successfully removed.  See procedure below for more detail.  Procedure Discussed with the patient about the risk and benefits of the procedure. Obtained written consent. The area was cleaned by alcohol swab x2. Anesthestized with 1% lidocaine and epinephrine using 27 gauge, 1.5 inch needle, a total of 4 cc injected.  The  skin tag was tied at the base to minimize bleeding.  Then the skin was sterilized using iodine. Using pick up forceps and scalpel, we excised the skin tag at the base above the knot. The knot came off spontaneously after excision. No bleeding.  The excision site was cauterized with silver nitrate, and dressed with guaze.  There is no complication after the procedure. Patient tolerated the procedure well.   Return if symptoms worsen or fail to improve.  Mercy Riding, MD 02/27/17 Pager: [phone]  Dr. Gwendlyn Deutscher was available during exam and procedure

## 2017-02-27 NOTE — Patient Instructions (Signed)

## 2017-03-13 ENCOUNTER — Other Ambulatory Visit: Payer: Self-pay | Admitting: *Deleted

## 2017-03-13 DIAGNOSIS — I10 Essential (primary) hypertension: Secondary | ICD-10-CM

## 2017-03-13 NOTE — Telephone Encounter (Signed)
Pt is out of medication. [name], [name], CMA

## 2017-03-14 MED ORDER — HYDROCHLOROTHIAZIDE 12.5 MG PO CAPS: 12.5000 mg | ORAL_CAPSULE | Freq: Every day | ORAL | 3 refills | Status: DC

## 2017-05-09 ENCOUNTER — Ambulatory Visit: Payer: Commercial Managed Care - PPO | Admitting: Family Medicine

## 2017-08-04 ENCOUNTER — Other Ambulatory Visit: Payer: Self-pay | Admitting: Student

## 2017-08-04 DIAGNOSIS — Z1231 Encounter for screening mammogram for malignant neoplasm of breast: Secondary | ICD-10-CM

## 2017-09-03 ENCOUNTER — Ambulatory Visit
Admission: RE | Admit: 2017-09-03 | Discharge: 2017-09-03 | Disposition: A | Discharge status: Home / Self Care | Payer: Commercial Managed Care - PPO | Source: Ambulatory Visit | Attending: Family Medicine | Admitting: Family Medicine

## 2017-09-03 DIAGNOSIS — Z1231 Encounter for screening mammogram for malignant neoplasm of breast: Secondary | ICD-10-CM

## 2017-10-31 ENCOUNTER — Ambulatory Visit (INDEPENDENT_AMBULATORY_CARE_PROVIDER_SITE_OTHER): Payer: Commercial Managed Care - PPO | Admitting: Family Medicine

## 2017-10-31 ENCOUNTER — Other Ambulatory Visit: Payer: Self-pay

## 2017-10-31 ENCOUNTER — Encounter: Payer: Self-pay | Admitting: Family Medicine

## 2017-10-31 VITALS — BP 122/78 | HR 67 | Temp 98.4°F | Ht 63.0 in | Wt 172.6 lb

## 2017-10-31 DIAGNOSIS — L815 Leukoderma, not elsewhere classified: Secondary | ICD-10-CM | POA: Insufficient documentation

## 2017-10-31 DIAGNOSIS — Z23 Encounter for immunization: Secondary | ICD-10-CM

## 2017-10-31 DIAGNOSIS — I10 Essential (primary) hypertension: Secondary | ICD-10-CM

## 2017-10-31 DIAGNOSIS — R739 Hyperglycemia, unspecified: Secondary | ICD-10-CM | POA: Diagnosis not present

## 2017-10-31 DIAGNOSIS — Z0000 Encounter for general adult medical examination without abnormal findings: Secondary | ICD-10-CM | POA: Diagnosis not present

## 2017-10-31 DIAGNOSIS — D500 Iron deficiency anemia secondary to blood loss (chronic): Secondary | ICD-10-CM | POA: Diagnosis not present

## 2017-10-31 DIAGNOSIS — R6883 Chills (without fever): Secondary | ICD-10-CM | POA: Diagnosis not present

## 2017-10-31 LAB — POCT GLYCOSYLATED HEMOGLOBIN (HGB A1C): Hemoglobin A1C: 5.7 % — AB (ref 4.0–5.6)

## 2017-10-31 MED ORDER — AMLODIPINE BESYLATE 10 MG PO TABS: 10.0000 mg | ORAL_TABLET | Freq: Every day | ORAL | 3 refills | Status: DC

## 2017-10-31 NOTE — Patient Instructions (Addendum)
It was nice meeting you today Ms. Slagter!  The spots on your skin are age-related and are not dangerous.  They may increase in number over the years.    We are getting several labs today to check your hemoglobin, electrolytes, and thyroid hormone.  I will call you if these results are abnormal.  We are stopping the HCTZ you've been taking and starting amlodipine today.  I would like to see you back in about one year or earlier if you need.  If you have any questions or concerns, please feel free to call the clinic.   Be well,  Dr. Shan Levans

## 2017-10-31 NOTE — Assessment & Plan Note (Signed)
Will obtain TSH today.  Doubtful that this is causing her symptoms given her normal measurements in the past, normal thyroid exam today, and few other symptoms of hypothyroidism.

## 2017-10-31 NOTE — Assessment & Plan Note (Signed)
Will obtain CBC and ferritin today.  Anemia could be contributing to her chills.

## 2017-10-31 NOTE — Progress Notes (Signed)
Subjective:    [name] - 47 y.o. female MRN [id number]  Date of birth: Mar 27, 1970  HPI  [name] is here for follow up of hypertension and anemia.  She is also concerned about white spots on her skin and says that she will often get very cold.  White spots on skin Duration 3 months Started on breast and stomach and have now progressed to her entire body except for her face She has done nothing to try to make them better and notes that nothing makes them worse Her mother also has the spots and does live with her She says she is prone to yeast infections, so she thinks this may be a fungal  Chills These have been going on for months, no concrete start date known They occur every few days, and she notes that she feels cold what other people do not Has noted a 5 pound weight gain in the last few months No change in energy or appetite No changes in hair Her last menstrual period was 8 months ago, and she thinks it could be related to menopause She is also worried that it could be related to her thyroid hormone function  Health Maintenance:  -Patient would like her Tdap today and wants her A1c checked since it has been elevated in the past Health Maintenance Due  Topic Date Due  . TETANUS/TDAP  01/06/2017  . INFLUENZA VACCINE  10/16/2017    -  reports that she has never smoked. She has never used smokeless tobacco. - Review of Systems: Per HPI. - Past Medical History: Patient Active Problem List   Diagnosis Date Noted  . 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 Chills without fever 10/31/2017  . Hyperglycemia 10/31/2017  . Skin tag 10/09/2016  . Malaise 09/07/2015  . Annual physical exam 08/11/2015  . Abnormal uterine bleeding 02/05/2012  . HYPERTENSION, BENIGN ESSENTIAL 01/07/2007  . Anemia, iron deficiency 05/15/2006   - Medications: reviewed and updated   Objective:   Physical Exam BP 122/78   Pulse 67   Temp 98.4 F (36.9 C) (Oral)   Ht 5\' 3"  (1.6 m)    Wt 172 lb 9.6 oz (78.3 kg)   SpO2 98%   BMI 30.57 kg/m  Gen: NAD, alert, cooperative with exam, well-appearing HEENT: thyroid gland without nodules or goiter CV: RRR, good S1/S2, no murmur Resp: CTABL, no wheezes, non-labored Abd: SNTND, BS present, no guarding or organomegaly Skin: diffuse, macular, hypopigmented spots throughout legs, arms, torso knee: Also spots of hyperpigmentation, which are more papular Psych: good insight, alert and oriented     Assessment & Plan:   Guttate hypomelanosis Patient reassured that these spots are age-related and are not dangerous to her health.  She asked about a dermatology referral, but she was advised that treatment would be cosmetic, likely expensive, and not be very effective.  HYPERTENSION, BENIGN ESSENTIAL Blood pressure normal today.  Will switch patient's regimen from HCTZ to amlodipine given the improved efficacy and side effect profile of amlodipine.  Also obtain BMP today.  Annual physical exam Patient received Tdap today.  Anemia, iron deficiency Will obtain CBC and ferritin today.  Anemia could be contributing to her chills.  Chills without fever Will obtain TSH today.  Doubtful that this is causing her symptoms given her normal measurements in the past, normal thyroid exam today, and few other symptoms of hypothyroidism.  Hyperglycemia Will obtain A1c today.    Maia Breslow, M.D. 10/31/2017, 11:04 AM PGY-2, [facility]

## 2017-10-31 NOTE — Assessment & Plan Note (Signed)
Patient reassured that these spots are age-related and are not dangerous to her health.  She asked about a dermatology referral, but she was advised that treatment would be cosmetic, likely expensive, and not be very effective.

## 2017-10-31 NOTE — Assessment & Plan Note (Signed)
Will obtain A1c today.

## 2017-10-31 NOTE — Assessment & Plan Note (Signed)
Blood pressure normal today.  Will switch patient's regimen from HCTZ to amlodipine given the improved efficacy and side effect profile of amlodipine.  Also obtain BMP today.

## 2017-10-31 NOTE — Assessment & Plan Note (Signed)
Patient received Tdap today.

## 2017-11-01 LAB — BASIC METABOLIC PANEL
BUN/Creatinine Ratio: 13 (ref 9–23)
BUN: 10 mg/dL (ref 6–24)
CALCIUM: 9.5 mg/dL (ref 8.7–10.2)
CHLORIDE: 104 mmol/L (ref 96–106)
CO2: 23 mmol/L (ref 20–29)
Creatinine, Ser: 0.79 mg/dL (ref 0.57–1.00)
GFR calc Af Amer: 102 mL/min/{1.73_m2} (ref >59)
GFR calc non Af Amer: 88 mL/min/{1.73_m2} (ref >59)
GLUCOSE: 97 mg/dL (ref 65–99)
POTASSIUM: 3.9 mmol/L (ref 3.5–5.2)
SODIUM: 139 mmol/L (ref 134–144)

## 2017-11-01 LAB — TSH: TSH: 2.13 u[IU]/mL (ref 0.450–4.500)

## 2017-11-01 LAB — CBC
Hematocrit: 38.2 % (ref 34.0–46.6)
Hemoglobin: 12.1 g/dL (ref 11.1–15.9)
MCH: 28.2 pg (ref 26.6–33.0)
MCHC: 31.7 g/dL (ref 31.5–35.7)
MCV: 89 fL (ref 79–97)
PLATELETS: 260 10*3/uL (ref 150–450)
RBC: 4.29 x10E6/uL (ref 3.77–5.28)
RDW: 14.1 % (ref 12.3–15.4)
WBC: 4.7 10*3/uL (ref 3.4–10.8)

## 2017-11-01 LAB — FERRITIN: FERRITIN: 169 ng/mL — AB (ref 15–150)

## 2017-11-06 ENCOUNTER — Encounter: Payer: Self-pay | Admitting: Family Medicine

## 2018-01-23 ENCOUNTER — Ambulatory Visit: Payer: Commercial Managed Care - PPO | Admitting: Family Medicine

## 2018-02-20 ENCOUNTER — Ambulatory Visit (INDEPENDENT_AMBULATORY_CARE_PROVIDER_SITE_OTHER): Payer: Commercial Managed Care - PPO | Admitting: Family Medicine

## 2018-02-20 DIAGNOSIS — Z0289 Encounter for other administrative examinations: Secondary | ICD-10-CM

## 2018-02-21 NOTE — Progress Notes (Signed)
Erroneous encounter, patient not seen

## 2018-03-04 ENCOUNTER — Ambulatory Visit (INDEPENDENT_AMBULATORY_CARE_PROVIDER_SITE_OTHER): Payer: Commercial Managed Care - PPO | Admitting: Family Medicine

## 2018-03-04 ENCOUNTER — Encounter: Payer: Self-pay | Admitting: Family Medicine

## 2018-03-04 VITALS — BP 114/76 | HR 64 | Temp 98.6°F | Ht 62.25 in | Wt 179.0 lb

## 2018-03-04 DIAGNOSIS — R6883 Chills (without fever): Secondary | ICD-10-CM | POA: Diagnosis not present

## 2018-03-04 DIAGNOSIS — I10 Essential (primary) hypertension: Secondary | ICD-10-CM | POA: Diagnosis not present

## 2018-03-04 DIAGNOSIS — L700 Acne vulgaris: Secondary | ICD-10-CM | POA: Diagnosis not present

## 2018-03-04 MED ORDER — TRETINOIN 0.025 % EX CREA: TOPICAL_CREAM | Freq: Every day | CUTANEOUS | 0 refills | Status: DC

## 2018-03-04 MED ORDER — AMLODIPINE BESYLATE 10 MG PO TABS: 10.0000 mg | ORAL_TABLET | Freq: Every day | ORAL | 3 refills | Status: DC

## 2018-03-04 NOTE — Assessment & Plan Note (Signed)
BP stable. Continue medications

## 2018-03-04 NOTE — Progress Notes (Signed)
Subjective:     [name] is a 47 y.o. female presenting for Establish Care ([facility]) and Skin Problem (skin break out on right cheeck. She came back from [place] October 24th and started to develop bumps 2 days prior to coming back. No other symptoms.)     HPI  #Skin Problem - breakout started in October 22 - not sure if it is something she used on her face - wonders if pimples - treatment - has not tried anything - washing her face with Clinque brand soap - stayed the same over time - the spots have not come an gone - no itchy - may be painful with touch - no drainage unless touching them   #Chills - LMP 1 year ago - not sure if this is related to menopause - hx of anemia - symptoms x 3 years - previously worked up in 09/2016 with normal TSH - will have episodes where she is in pain and feeling feverish with chills but no fever - tried using Tumeric and the body ache has resolved - still has the chills symptoms but the other symptoms have resolved - not associated with palpitations or SOB   Review of Systems  Constitutional: Negative for fever.  HENT: Negative for congestion, sinus pressure and sore throat.   Gastrointestinal: Negative for blood in stool.  Genitourinary: Negative for hematuria.  Musculoskeletal: Negative for arthralgias and myalgias.     Social History   Tobacco Use  Smoking Status Never Smoker  Smokeless Tobacco Never Used        Objective:    BP Readings from Last 3 Encounters:  03/04/18 114/76  10/31/17 122/78  02/27/17 110/64   Wt Readings from Last 3 Encounters:  03/04/18 179 lb (81.2 kg)  10/31/17 172 lb 9.6 oz (78.3 kg)  02/27/17 170 lb (77.1 kg)    BP 114/76   Pulse 64   Temp 98.6 F (37 C)   Ht 5' 2.25" (1.581 m)   Wt 179 lb (81.2 kg)   SpO2 99%   BMI 32.48 kg/m    Physical Exam Constitutional:      General: She is not in acute distress.    Appearance: She  is well-developed. She is not diaphoretic.  HENT:     Right Ear: External ear normal.     Left Ear: External ear normal.     Nose: Nose normal.  Eyes:     Conjunctiva/sclera: Conjunctivae normal.  Neck:     Musculoskeletal: Neck supple.  Cardiovascular:     Rate and Rhythm: Normal rate and regular rhythm.     Heart sounds: No murmur.  Pulmonary:     Effort: Pulmonary effort is normal. No respiratory distress.     Breath sounds: Normal breath sounds.  Skin:    General: Skin is warm and dry.     Capillary Refill: Capillary refill takes less than 2 seconds.     Comments: Several comedone pimples on the bilateral cheeks.   Neurological:     Mental Status: She is alert. Mental status is at baseline.  Psychiatric:        Mood and Affect: Mood normal.        Behavior: Behavior normal.           Assessment & Plan:   Problem List Items Addressed This Visit      Cardiovascular and Mediastinum   HYPERTENSION, BENIGN ESSENTIAL - Primary (Chronic)  BP stable. Continue medications      Relevant Medications   amLODipine (NORVASC) 10 MG tablet     Musculoskeletal and Integument   Acne vulgaris    Trial fo Tretinoin, add benzoyl peroxide if not improvement. Will do derm referral if not responding to treatment      Relevant Medications   tretinoin (RETIN-A) 0.025 % cream     Other   Chills without fever    Chronic intermittent problem with neg work-up x 2. Normal TSH and labs previously. Only happening every 6 weeks so recommended watch and wait.           Return in about 6 months (around 09/03/2018), or if symptoms worsen or fail to improve.  Lesleigh Noe, MD

## 2018-03-04 NOTE — Assessment & Plan Note (Signed)
Chronic intermittent problem with neg work-up x 2. Normal TSH and labs previously. Only happening every 6 weeks so recommended watch and wait.

## 2018-03-04 NOTE — Assessment & Plan Note (Signed)
Trial fo Tretinoin, add benzoyl peroxide if not improvement. Will do derm referral if not responding to treatment

## 2018-03-04 NOTE — Patient Instructions (Addendum)
Start this routine for your Acne management  Morning:  1. Wash face with gentle cleanser  Night:  1. Wash face with gentle cleanser 2. Apply a thin layer of tretinoin ([employer]ita, Retin-A)  Over the counter Salicylic Acid if Retin-A too expensive  It may take up to 8 weeks to notice a difference, so stick with it!  If not difference - add Benzoyl Peroxide -- which you can get over the counter to your morning routine  Dry Skin: choose creams or moisturizers  Acne under hair: try foams  Side Effects Topical retin - local redness, peeling, dryness, pruritus, stinging Benzoyl Peroxide - dry skin, local erythema (over the counter)  Let me know if this is not improving in 4 weeks and we can have you see Dermatology for a second opinion

## 2018-10-09 ENCOUNTER — Other Ambulatory Visit: Payer: Self-pay | Admitting: Family Medicine

## 2018-10-09 DIAGNOSIS — Z1231 Encounter for screening mammogram for malignant neoplasm of breast: Secondary | ICD-10-CM

## 2018-11-26 ENCOUNTER — Ambulatory Visit
Admission: RE | Admit: 2018-11-26 | Discharge: 2018-11-26 | Disposition: A | Discharge status: Home / Self Care | Payer: Commercial Managed Care - PPO | Source: Ambulatory Visit | Attending: Family Medicine | Admitting: Family Medicine

## 2018-11-26 ENCOUNTER — Other Ambulatory Visit: Payer: Self-pay

## 2018-11-26 DIAGNOSIS — Z1231 Encounter for screening mammogram for malignant neoplasm of breast: Secondary | ICD-10-CM

## 2018-11-30 ENCOUNTER — Other Ambulatory Visit: Payer: Self-pay | Admitting: Family Medicine

## 2018-11-30 DIAGNOSIS — R928 Other abnormal and inconclusive findings on diagnostic imaging of breast: Secondary | ICD-10-CM

## 2018-12-02 ENCOUNTER — Other Ambulatory Visit: Payer: Self-pay | Admitting: Family Medicine

## 2018-12-04 ENCOUNTER — Other Ambulatory Visit: Payer: Self-pay | Admitting: Family Medicine

## 2018-12-04 ENCOUNTER — Ambulatory Visit
Admission: RE | Admit: 2018-12-04 | Discharge: 2018-12-04 | Disposition: A | Discharge status: Home / Self Care | Payer: Commercial Managed Care - PPO | Source: Ambulatory Visit | Attending: Family Medicine | Admitting: Family Medicine

## 2018-12-04 ENCOUNTER — Other Ambulatory Visit: Payer: Self-pay

## 2018-12-04 DIAGNOSIS — R928 Other abnormal and inconclusive findings on diagnostic imaging of breast: Secondary | ICD-10-CM

## 2018-12-04 DIAGNOSIS — N6310 Unspecified lump in the right breast, unspecified quadrant: Secondary | ICD-10-CM

## 2018-12-04 DIAGNOSIS — N6001 Solitary cyst of right breast: Secondary | ICD-10-CM | POA: Diagnosis not present

## 2018-12-15 ENCOUNTER — Other Ambulatory Visit: Payer: Self-pay | Admitting: Family Medicine

## 2018-12-17 ENCOUNTER — Other Ambulatory Visit: Payer: Self-pay | Admitting: Family Medicine

## 2018-12-17 DIAGNOSIS — N6310 Unspecified lump in the right breast, unspecified quadrant: Secondary | ICD-10-CM

## 2018-12-22 ENCOUNTER — Encounter: Payer: Commercial Managed Care - PPO | Admitting: Family Medicine

## 2019-01-06 ENCOUNTER — Encounter: Payer: Commercial Managed Care - PPO | Admitting: Family Medicine

## 2019-01-13 ENCOUNTER — Ambulatory Visit (INDEPENDENT_AMBULATORY_CARE_PROVIDER_SITE_OTHER): Payer: BC Managed Care – PPO | Admitting: Family Medicine

## 2019-01-13 ENCOUNTER — Other Ambulatory Visit: Payer: Self-pay

## 2019-01-13 ENCOUNTER — Encounter: Payer: Self-pay | Admitting: Family Medicine

## 2019-01-13 VITALS — BP 108/80 | HR 72 | Temp 98.0°F | Ht 63.0 in | Wt 174.8 lb

## 2019-01-13 DIAGNOSIS — R221 Localized swelling, mass and lump, neck: Secondary | ICD-10-CM | POA: Insufficient documentation

## 2019-01-13 DIAGNOSIS — R32 Unspecified urinary incontinence: Secondary | ICD-10-CM | POA: Diagnosis not present

## 2019-01-13 DIAGNOSIS — Z0000 Encounter for general adult medical examination without abnormal findings: Secondary | ICD-10-CM | POA: Diagnosis not present

## 2019-01-13 DIAGNOSIS — R7303 Prediabetes: Secondary | ICD-10-CM | POA: Insufficient documentation

## 2019-01-13 DIAGNOSIS — N951 Menopausal and female climacteric states: Secondary | ICD-10-CM

## 2019-01-13 DIAGNOSIS — I10 Essential (primary) hypertension: Secondary | ICD-10-CM

## 2019-01-13 DIAGNOSIS — N941 Unspecified dyspareunia: Secondary | ICD-10-CM | POA: Insufficient documentation

## 2019-01-13 DIAGNOSIS — Z1211 Encounter for screening for malignant neoplasm of colon: Secondary | ICD-10-CM

## 2019-01-13 LAB — BASIC METABOLIC PANEL
BUN: 12 mg/dL (ref 6–23)
CO2: 28 mEq/L (ref 19–32)
Calcium: 9.4 mg/dL (ref 8.4–10.5)
Chloride: 106 mEq/L (ref 96–112)
Creatinine, Ser: 0.82 mg/dL (ref 0.40–1.20)
GFR: 89 mL/min (ref >60.00)
Glucose, Bld: 101 mg/dL — ABNORMAL HIGH (ref 70–99)
Potassium: 3.8 mEq/L (ref 3.5–5.1)
Sodium: 140 mEq/L (ref 135–145)

## 2019-01-13 LAB — LIPID PANEL
Cholesterol: 139 mg/dL (ref 0–200)
HDL: 45.4 mg/dL (ref >39.00)
LDL Cholesterol: 80 mg/dL (ref 0–99)
NonHDL: 93.56
Total CHOL/HDL Ratio: 3
Triglycerides: 70 mg/dL (ref 0.0–149.0)
VLDL: 14 mg/dL (ref 0.0–40.0)

## 2019-01-13 MED ORDER — ESTROGENS, CONJUGATED 0.625 MG/GM VA CREA: 0.2500 | TOPICAL_CREAM | VAGINAL | 12 refills | Status: DC

## 2019-01-13 NOTE — Assessment & Plan Note (Signed)
Discussed pelvic floor PT and given urinary symptoms too, she decided to try this. Will also start estrogen topical given dryness

## 2019-01-13 NOTE — Addendum Note (Signed)
Addended by: Cloyd Stagers on: 01/13/2019 01:35 PM   Modules accepted: Orders

## 2019-01-13 NOTE — Patient Instructions (Addendum)
#  Pelvic pain - switch to a different lubrication - try topical estrogen - use 2 times per week - pelvic floor physical therapy - return to check in in 6 weeks to see if we need to adjust  #Neck swelling - labs and ultrasound  #Colonoscopy  - referral to GI

## 2019-01-13 NOTE — Assessment & Plan Note (Signed)
Controlled on current medication ?

## 2019-01-13 NOTE — Progress Notes (Signed)
Annual Exam   Chief Complaint:  Chief Complaint  Patient presents with  . Annual Exam    History of Present Illness:  [name] is a 48 y.o. LMP was Patient's last menstrual period was 12/06/2014., presents today for her annual examination.    #neck fullness - has noticed that her neck is looking fuller - and friends have commented too - more than 1 year  #Breast issue - noted to have cysts on last ultrasound - f/u in 6 months  Nutrition She does get adequate calcium and Vitamin D in her diet. Diet: low carb diet, veggies and meat Exercise: walking 3 miles a day  Safety The patient wears seatbelts: yes.     The patient feels safe at home and in their relationships: yes.   Menstrual Last period was 1 year ago Symptoms: colder than normal, no hot flashes, sleeping OK, possible mood swings  GYN She is single partner, contraception - post menopausal - pain with intercourse - using lubrication w/o significant improvement Hx of STDs: none, long time ago She does not want STI testing today.   Cervical Cancer Screening:   Last Pap: 3 years ago  Breast Cancer Screening There is no FH of breast cancer. There is no FH of ovarian cancer. BRCA screening No.  Last Mammogram: 11/2018    Colon Cancer Screening Age 30-75 yo - benefits outweigh the risk. Adults 33-50 yo who have never been screened benefit.  Benefits: 134000 people in 2016 will be diagnosed and 49,000 will die - early detection helps Harms: Complications 2/2 to colonoscopy High Risk (Colonoscopy): genetic disorder (Lynch syndrome or familial adenomatous polyposis), personal hx of IBD, previous adenomatous polyp, or previous colorectal cancer, FamHx start 10 years before the age at diagnosis, increased in males and black race  Options:  FIT - looks for hemoglobin (blood in the stool) - specific and fairly sensitive - must be done annually Cologuard - looks for DNA and blood - more sensitive -  therefore can have more false positives, every 3 years Colonoscopy - every 10 years if normal - sedation, bowl prep, must have someone drive you  Shared decision making and the patient had decided to do colonoscopy.     Weight Wt Readings from Last 3 Encounters:  01/13/19 174 lb 12 oz (79.3 kg)  03/04/18 179 lb (81.2 kg)  10/31/17 172 lb 9.6 oz (78.3 kg)   Patient has high BMI  BMI Readings from Last 1 Encounters:  01/13/19 30.96 kg/m     Chronic disease screening Blood pressure monitoring:  BP Readings from Last 3 Encounters:  01/13/19 108/80  03/04/18 114/76  10/31/17 122/78    Lipid Monitoring: Indication for screening: age >64, obesity, diabetes, family hx, CV risk factors.  Lipid screening: Yes  Lab Results  Component Value Date   CHOL 133 10/03/2016   HDL 49 10/03/2016   LDLCALC 71 10/03/2016   TRIG 65 10/03/2016   CHOLHDL 2.7 10/03/2016     Diabetes Screening: age >48, overweight, family hx, PCOS, hx of gestational diabetes, at risk ethnicity Diabetes Screening screening: Yes  Lab Results  Component Value Date   HGBA1C 5.7 (A) 10/31/2017     Past Medical History:  Diagnosis Date  . Allergy   . Anemia   . Hypertension   . Uterine fibroid     No past surgical history on file.  Prior to Admission medications   Medication Sig Start Date End Date Taking? Authorizing Provider  amLODipine (  NORVASC) 10 MG tablet Take 1 tablet (10 mg total) by mouth daily. 03/04/18  Yes Lesleigh Noe, MD  Multiple Vitamin (MULTIVITAMIN) tablet Take 1 tablet by mouth daily.   Yes [provider]    Allergies  Allergen Reactions  . Vicodin [Hydrocodone-Acetaminophen] Shortness Of Breath    Gynecologic History: Patient's last menstrual period was 12/06/2014.  Obstetric History: No obstetric history on file.  Social History   Socioeconomic History  . Marital status: Married    Spouse name: Pilar Plate  . Number of children: 2  . Years of education: LPN  and elementary education degree  . Highest education level: Not on file  Occupational History  . Not on file  Social Needs  . Financial resource strain: Not hard at all  . Food insecurity    Worry: Not on file    Inability: Not on file  . Transportation needs    Medical: Not on file    Non-medical: Not on file  Tobacco Use  . Smoking status: Never Smoker  . Smokeless tobacco: Never Used  Substance and Sexual Activity  . Alcohol use: No  . Drug use: No  . Sexual activity: Yes    Birth control/protection: Other-see comments    Comment: LMP 1 year ago  Lifestyle  . Physical activity    Days per week: Not on file    Minutes per session: Not on file  . Stress: Not on file  Relationships  . Social Herbalist on phone: Not on file    Gets together: Not on file    Attends religious service: Not on file    Active member of club or organization: Not on file    Attends meetings of clubs or organizations: Not on file    Relationship status: Not on file  . Intimate partner violence    Fear of current or ex partner: Not on file    Emotionally abused: Not on file    Physically abused: Not on file    Forced sexual activity: Not on file  Other Topics Concern  . Not on file  Social History Narrative   Lives with Pilar Plate   Step son - Merry Proud   2 children - [name]   Also her mother lives in the home   Enjoys: traveling to [place] for vacation ([place])   Exercise: 7 minute intense exercise app that she does 2-3 times a week   Diet: avoids eating after 6 pm, avoids fruit due to prediabetes, eats veggies, meat    Family History  Problem Relation Age of Onset  . Diabetes Mother   . Hypertension Mother   . Stroke Mother 71  . Heart failure Father        s/p heart transplant  . Leukemia Sister   . Hypertension Brother   . Pancreatic cancer Paternal Grandmother     Review of Systems  Constitutional: Negative for chills, fever, malaise/fatigue and weight loss.  HENT:  Negative.   Eyes: Positive for blurred vision (went to eye doctor and has not gotten prescription).  Respiratory: Negative.   Cardiovascular: Negative.   Gastrointestinal: Positive for constipation. Negative for blood in stool, diarrhea, heartburn and nausea.  Genitourinary: Positive for frequency.       Incomplete emptying  Musculoskeletal: Negative.   Skin: Negative.   Neurological: Negative.   Endo/Heme/Allergies: Negative.   Psychiatric/Behavioral: Negative.      Physical Exam BP 108/80   Pulse 72   Temp 98  F (36.7 C)   Ht _0  (1.6 m)   Wt 174 lb 12 oz (79.3 kg)   LMP 12/06/2014   SpO2 98%   BMI 30.96 kg/m    BP Readings from Last 3 Encounters:  01/13/19 108/80  03/04/18 114/76  10/31/17 122/78      Physical Exam Constitutional:      General: She is not in acute distress.    Appearance: She is well-developed. She is not diaphoretic.  HENT:     Head: Normocephalic and atraumatic.     Right Ear: External ear normal.     Left Ear: External ear normal.     Nose: Nose normal.  Eyes:     General: No scleral icterus.    Conjunctiva/sclera: Conjunctivae normal.  Neck:     Musculoskeletal: Full passive range of motion without pain and neck supple. No neck rigidity.     Thyroid: No thyroid mass or thyromegaly.     Comments: Fullness over the sternocleidomastoid muscle Cardiovascular:     Rate and Rhythm: Normal rate and regular rhythm.     Heart sounds: No murmur.  Pulmonary:     Effort: Pulmonary effort is normal. No respiratory distress.     Breath sounds: Normal breath sounds. No wheezing.  Abdominal:     General: Bowel sounds are normal. There is no distension.     Palpations: Abdomen is soft. There is no mass.     Tenderness: There is no abdominal tenderness. There is no guarding or rebound.  Musculoskeletal: Normal range of motion.  Lymphadenopathy:     Cervical: No cervical adenopathy.  Skin:    General: Skin is warm and dry.     Capillary Refill:  Capillary refill takes less than 2 seconds.  Neurological:     Mental Status: She is alert and oriented to person, place, and time.     Deep Tendon Reflexes: Reflexes normal.  Psychiatric:        Behavior: Behavior normal.       Results:  No depression    Assessment: 48 y.o. No obstetric history on file. female here for routine annual physical examination.  Plan: Problem List Items Addressed This Visit      Cardiovascular and Mediastinum   HYPERTENSION, BENIGN ESSENTIAL (Chronic)    Controlled on current medication        Genitourinary   Menopausal vaginal dryness - Primary   Relevant Medications   conjugated estrogens (PREMARIN) vaginal cream (Start on 01/14/2019)     Other   Annual physical exam   Relevant Orders   Lipid panel   Prediabetes   Relevant Orders   Hemoglobin A1c   Neck fullness   Relevant Orders   TSH   T4, free   US Soft Tissue Head/Neck   Dyspareunia, female    Discussed pelvic floor PT and given urinary symptoms too, she decided to try this. Will also start estrogen topical given dryness      Relevant Orders   Ambulatory referral to Physical Therapy    Other Visit Diagnoses    Urinary incontinence, unspecified type       Relevant Orders   Ambulatory referral to Physical Therapy   Screening for colon cancer       Relevant Orders   Ambulatory referral to Gastroenterology      Screening: -- Blood pressure screen at goal -- cholesterol screening: will obtain -- Weight screening: overweight: continue to monitor -- Diabetes Screening: will obtain -- Nutrition: normal  The 10-year ASCVD risk score Mikey Bussing DC Brooke Bonito., et al., 2013) is: 1.4%   Values used to calculate the score:     Age: 88 years     Sex: Female     Is Non-Hispanic African American: Yes     Diabetic: No     Tobacco smoker: No     Systolic Blood Pressure: 620 mmHg     Is BP treated: Yes     HDL Cholesterol: 49 mg/dL     Total Cholesterol: 133 mg/dL  -- Statin therapy  for Age 25-75 with CVD risk >7.5%  Psych -- Depression screening (PHQ-9): low risk   Safety -- tobacco screening: not using -- alcohol screening:  low-risk usage. -- no evidence of domestic violence or intimate partner violence.   Cancer Screening -- pap smear up to date per ASCCP guidelines -- family history of breast cancer screening: done. not at high risk. -- Mammogram - up to date  Immunizations -- flu vaccine up to date -- TDAP q10 years up to date    Lesleigh Noe, MD

## 2019-01-14 LAB — T4, FREE: Free T4: 0.85 ng/dL (ref 0.60–1.60)

## 2019-01-14 LAB — TSH: TSH: 1.28 u[IU]/mL (ref 0.35–4.50)

## 2019-01-14 LAB — HEMOGLOBIN A1C: Hgb A1c MFr Bld: 6.2 % (ref 4.6–6.5)

## 2019-01-15 ENCOUNTER — Ambulatory Visit: Payer: BC Managed Care – PPO | Admitting: Physical Therapy

## 2019-01-18 ENCOUNTER — Other Ambulatory Visit: Payer: Self-pay | Admitting: Family Medicine

## 2019-01-18 ENCOUNTER — Ambulatory Visit
Admission: RE | Admit: 2019-01-18 | Discharge: 2019-01-18 | Disposition: A | Discharge status: Home / Self Care | Payer: BC Managed Care – PPO | Source: Ambulatory Visit | Attending: Family Medicine | Admitting: Family Medicine

## 2019-01-18 DIAGNOSIS — E041 Nontoxic single thyroid nodule: Secondary | ICD-10-CM | POA: Diagnosis not present

## 2019-01-18 DIAGNOSIS — R221 Localized swelling, mass and lump, neck: Secondary | ICD-10-CM

## 2019-01-19 ENCOUNTER — Telehealth: Payer: Self-pay | Admitting: Family Medicine

## 2019-01-19 DIAGNOSIS — E041 Nontoxic single thyroid nodule: Secondary | ICD-10-CM

## 2019-01-19 NOTE — Telephone Encounter (Signed)
Let patient know that her ultrasound showed a thyroid nodule. It is recommended that this be biopsied.   I placed a referral to surgery to get the biopsy done.   I'm happy to discuss with her on the phone if she has more questions.

## 2019-01-19 NOTE — Telephone Encounter (Signed)
Patient advised and verbalized understanding 

## 2019-01-22 ENCOUNTER — Ambulatory Visit: Payer: BC Managed Care – PPO | Admitting: Physical Therapy

## 2019-01-25 ENCOUNTER — Encounter: Payer: Self-pay | Admitting: Gastroenterology

## 2019-02-03 ENCOUNTER — Other Ambulatory Visit: Payer: Self-pay

## 2019-02-03 ENCOUNTER — Ambulatory Visit: Payer: BC Managed Care – PPO | Attending: Family Medicine | Admitting: Physical Therapy

## 2019-02-03 ENCOUNTER — Encounter: Payer: Self-pay | Admitting: Physical Therapy

## 2019-02-03 DIAGNOSIS — R278 Other lack of coordination: Secondary | ICD-10-CM | POA: Diagnosis not present

## 2019-02-03 DIAGNOSIS — N941 Unspecified dyspareunia: Secondary | ICD-10-CM | POA: Diagnosis not present

## 2019-02-03 DIAGNOSIS — M6281 Muscle weakness (generalized): Secondary | ICD-10-CM | POA: Insufficient documentation

## 2019-02-03 DIAGNOSIS — N3941 Urge incontinence: Secondary | ICD-10-CM | POA: Diagnosis not present

## 2019-02-03 NOTE — Therapy (Addendum)
[facility] [address] [place], [place], [address] Phone: [phone]   Fax:  [phone]  Physical Therapy Evaluation  Patient Details  Name: [name] MRN: [id number] Date of Birth: June 20, 1970 Referring Provider (PT): Dr. Waunita Schooner   Encounter Date: 02/03/2019  PT End of Session - 02/03/19 1315    Visit Number  1    Date for PT Re-Evaluation  03/31/19    Authorization Type  BCBS    PT Start Time  1230    PT Stop Time  1300    PT Time Calculation (min)  30 min    Activity Tolerance  Patient tolerated treatment well;No increased pain    Behavior During Therapy  WFL for tasks assessed/performed       Past Medical History:  Diagnosis Date  . Allergy   . Anemia   . Hypertension   . Uterine fibroid     History reviewed. No pertinent surgical history.  There were no vitals filed for this visit.   Subjective Assessment - 02/03/19 1232    Subjective  Urinary leakage started 2 months ago. When patient urinates and has to urinate again. Patient is urinating frequently. Pain with intercourse for 5 years ago. Urinate every 30 minutes.    Patient Stated Goals  empty the bladder fully, no pain with intercourse    Currently in Pain?  Yes    Pain Score  4     Pain Location  Vagina    Pain Orientation  Mid    Pain Descriptors / Indicators  Dull;Burning    Pain Type  Chronic pain    Pain Onset  More than a month ago    Pain Frequency  Intermittent    Aggravating Factors   initial penetration, lubricants burn    Pain Relieving Factors  no intercourse    Multiple Pain Sites  No         [facility] PT Assessment - 02/03/19 0001      Assessment   Medical Diagnosis  N94.10 Dyspareunia, female; R32 Urinary incontinence, unspecified type    Referring Provider (PT)  Dr. Waunita Schooner    Onset Date/Surgical Date  11/03/18    Prior Therapy  none      Precautions   Precautions  None      Restrictions   Weight Bearing Restrictions  No      Balance Screen   Has the patient fallen in the past 6 months  No    Has the patient had a decrease in activity level because of a fear of falling?   No    Is the patient reluctant to leave their home because of a fear of falling?   No      Home Film/video editor residence      Prior Function   Level of Independence  Independent    Leisure  walks daily for 4 miles      Cognition   Overall Cognitive Status  Within Functional Limits for tasks assessed      Posture/Postural Control   Posture/Postural Control  No significant limitations      ROM / Strength   AROM / PROM / Strength  AROM;PROM;Strength      AROM   Overall AROM Comments  lumbar ROM is full      Strength   Overall Strength  Within functional limits for tasks performed    Overall Strength Comments  bilateral hip  strength is 5/5      Palpation   Palpation comment  tightness in the C-sections scar                Objective measurements completed on examination: See above findings.    Pelvic Floor Special Questions - 02/03/19 0001    Prior Pregnancies  Yes    Number of C-Sections  3    Currently Sexually Active  Yes    Is this Painful  Yes    Marinoff Scale  discomfort that does not affect completion    Urinary urgency  No    Urinary frequency  every 30 minutes    Fecal incontinence  No   constipated, take prune juice, every other day BM   Skin Integrity  Intact   dry   Pelvic Floor Internal Exam  Patient confirms identification and approves PT to assess the pelvic floor and treatment    Exam Type  Vaginal    Palpation  tightness in the perineal body, and introitus    Strength  weak squeeze, no lift       [facility] Adult PT Treatment/Exercise - 02/03/19 0001      Self-Care   Self-Care  Other Self-Care Comments    Other Self-Care Comments   educated patient on vaginal lubricants and self perineal massage             PT Education -  02/03/19 1313    Education Details  stretching of the pelvic floor and information on vaginal lubricants ;gave patient samples of vaginal lubricants    Person(s) Educated  Patient    Methods  Explanation;Demonstration;Handout    Comprehension  Verbalized understanding;Returned demonstration       PT Short Term Goals - 02/03/19 1322      PT SHORT TERM GOAL #1   Title  independent with initial HEP    Time  4    Period  Weeks    Status  New        PT Long Term Goals - 02/03/19 1322      PT LONG TERM GOAL #1   Title  independent with advanced HEP    Time  8    Period  Weeks    Status  New    Target Date  03/31/19      PT LONG TERM GOAL #2   Title  able to have penile penetration with pain level </= 1-2/10 due to improved moisture and elongation of tissue    Time  8    Period  Weeks    Status  New    Target Date  03/31/19      PT LONG TERM GOAL #3   Title  able to fully empty her bladder and not have to go to the bathroom 30 minutes after urinating due to increased elongation of tissue and pelvic floor strength    Time  8    Period  Weeks    Status  New    Target Date  03/31/19      PT LONG TERM GOAL #4   Title  understand correct toileting technique to fully empty her bladder and decrease strain of pelvic floor with a bowel movement    Time  8    Period  Weeks    Status  New    Target Date  03/31/19             Plan - 02/03/19 1315    Clinical Impression Statement  Patient is a 48 year old female with dyspareunia and urinary incontinence for the past several months. Patient reports pain vaginally with intercourse is 4/10 at initial penetration and during. Marinoff score is 1/3. Pelvic floors strength is 2/5 and tightness along the perineal body and introitus. Patient reports she has to go to the bathroom 30 minutes after she already went and does not feel like she has fully emptied her bladder. Patient reports difficulty with constipation and has to strain to  have a bowel movement.  Patient has vaginal dryness. Patient was given Estrogen topical but she has not used it. Patient would benefit from skilled therapy to improve emptying of her bladder and reduction of pain with penile penetration.    Personal Factors and Comorbidities  Comorbidity 1;Sex    Comorbidities  C-section 3x    Examination-Activity Limitations  Toileting    Examination-Participation Restrictions  Interpersonal Relationship    Stability/Clinical Decision Making  Stable/Uncomplicated    Clinical Decision Making  Low    Rehab Potential  Excellent    PT Frequency  1x / week    PT Duration  8 weeks    PT Treatment/Interventions  Biofeedback;Cryotherapy;Electrical Stimulation;Moist Heat;Therapeutic exercise;Therapeutic activities;Neuromuscular re-education;Patient/family education;Dry needling;Manual techniques    PT Next Visit Plan  work on perineal body and introitus, abdominal massage, toileting technique, urge to void    Consulted and Agree with Plan of Care  Patient       Patient will benefit from skilled therapeutic intervention in order to improve the following deficits and impairments:  Increased fascial restricitons, Decreased endurance, Increased muscle spasms, Pain, Decreased activity tolerance, Decreased scar mobility, Decreased strength  Visit Diagnosis: Muscle weakness (generalized) - Plan: PT plan of care cert/re-cert  Other lack of coordination - Plan: PT plan of care cert/re-cert  Dyspareunia, female - Plan: PT plan of care cert/re-cert  Urge incontinence - Plan: PT plan of care cert/re-cert     Problem List Patient Active Problem List   Diagnosis Date Noted  . Prediabetes 01/13/2019  . Neck fullness 01/13/2019  . Dyspareunia, female 01/13/2019  . Menopausal vaginal dryness 01/13/2019  . Acne vulgaris 03/04/2018  . 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 Chills without fever 10/31/2017  . Skin tag 10/09/2016  . Malaise 09/07/2015  . Annual physical  exam 08/11/2015  . Abnormal uterine bleeding 02/05/2012  . HYPERTENSION, BENIGN ESSENTIAL 01/07/2007  . Anemia, iron deficiency 05/15/2006    Earlie Counts, PT 02/03/19 1:28 PM   [facility] [address], [place], [place], [address] Phone: [phone]   Fax:  [phone]  Name: [name] MRN: [id number] Date of Birth: 1970/04/07  PHYSICAL THERAPY DISCHARGE SUMMARY  Visits from Start of Care: 1  Current functional level related to goals / functional outcomes: See above. Patient has not returned to therapy since 02/03/2019.    Remaining deficits: See above.    Education / Equipment: HEP Plan:                                                    Patient goals were not met. Patient is being discharged due to not returning since the last visit. Thank you for the referral. Earlie Counts, PT 03/29/19 10:46 AM   ?????

## 2019-02-03 NOTE — Patient Instructions (Addendum)
STRETCHING THE PELVIC FLOOR MUSCLES NO DILATOR  Supplies . Vaginal lubricant . Mirror (optional) . Gloves (optional) Positioning . Start in a semi-reclined position with your head propped up. Bend your knees and place your thumb or finger at the vaginal opening. Procedure . Apply a moderate amount of lubricant on the outer skin of your vagina, the labia minora.  Apply additional lubricant to your finger. Marland Kitchen Spread the skin away from the vaginal opening. Place the end of your finger at the opening. . Do a maximum contraction of the pelvic floor muscles. Tighten the vagina and the anus maximally and relax. . When you know they are relaxed, gently and slowly insert your finger into your vagina, directing your finger slightly downward, for 2-3 inches of insertion. . Relax and stretch the 6 o'clock position . Hold each stretch for _2 min__ and repeat __1_ time with rest breaks of _1__ seconds between each stretch. . Repeat the stretching in the 4 o'clock and 8 o'clock positions. . Total time should be _6__ minutes, _1__ x per day.  Note the amount of theme your were able to achieve and your tolerance to your finger in your vagina. . Once you have accomplished the techniques you may try them in standing with one foot resting on the tub, or in other positions.  This is a good stretch to do in the shower if you don't need to use lubricant.  [facility] [address] [place], [place] [address] Phone # [phone] Fax [phone]    Lubrication . Used for intercourse to reduce friction . Avoid ones that have glycerin, warming gels, tingling gels, icing or cooling gel, scented . Avoid parabens due to a preservative similar to female sex hormone . May need to be reapplied once or several times during sexual activity . Can be applied to both partners genitals prior to vaginal penetration to minimize friction or irritation . Prevent irritation and mucosal tears that cause  post coital pain and increased the risk of vaginal and urinary tract infections . Oil-based lubricants cannot be used with condoms due to breaking them down.  Least likely to irritate vaginal tissue.  . Plant based-lubes are safe . Silicone-based lubrication are thicker and last long and used for post-menopausal women  Vaginal Lubricators Here is a list of some suggested lubricators you can use for intercourse. Use the most hypoallergenic product.  You can place on you or your partner.   Slippery Stuff  Sylk or Sliquid Natural H2O ( good  if frequent UTI's)  Blossom Organics (www.blossom-organics.com)  Coconut oil, olive oil   PJur Woman Nude- water based lubricant, amazon  [place] has an organic one  Yes lubricant- Retail buyer, [employer], [facility]  Olive and Bee intimate cream-  www.oliveandbee.com.au  Pink - [employer]  Hybird OASIS Things to avoid in lubricants are glycerin, warming gels, tingling gels, icing or cooling  gels, and scented gels.  Also avoid Vaseline. KY jelly, Replens, and Astroglide kills good bacteria(lactobacilli)  Things to avoid in the vaginal area . Do not use things to irritate the vulvar area . No lotions- see below . No soaps; can use Aveeno or Calendula cleanser if needed. Must be gentle . No deodorants . No douches . Good to sleep without underwear to let the vaginal area to air out . No scrubbing: spread the lips to let warm water rinse over labias and pat dry  Creams that can  be used on the [place]  MoonMaid Botanical Pro-Meno Wild [employer], olive oil  Desert Havest Releveum or [employer]

## 2019-02-10 ENCOUNTER — Telehealth: Payer: Self-pay

## 2019-02-10 NOTE — Telephone Encounter (Signed)
Pt seen for annual exam on 01/13/19. Pt said she is already scheduled for her first colonoscopy on 03/03/19 and pt usually has constipation but today pt had normal BM and there was no blood in tissue or in the water of the commode but pt thinks she saw some red blood streaked on stool. No abd pain. Pt has no covid symptoms, no travel and no known exposure to + covid. Pt wants to know if she can do a stool card. Pt does not want to schedule appt. Dr Einar Pheasant has left office. Please advise. Pt request cb. UC & ED precautions given and pt voiced understanding.

## 2019-02-10 NOTE — Telephone Encounter (Signed)
Please call patient and let her know that the best work up for possible blood in the stool is colonoscopy which is already scheduled. There would be no benefit to doing a stool card because if it is positive, the work up is colonoscopy. If she sees a significant amount of blood in her toilet, has weakness/dizziness or new symptoms, please call the office.

## 2019-02-10 NOTE — Telephone Encounter (Signed)
Pt aware of recommendations per Debbie.   Nothing further needed.  

## 2019-02-22 ENCOUNTER — Ambulatory Visit (HOSPITAL_BASED_OUTPATIENT_CLINIC_OR_DEPARTMENT_OTHER): Payer: BC Managed Care – PPO | Admitting: *Deleted

## 2019-02-22 ENCOUNTER — Other Ambulatory Visit: Payer: Self-pay

## 2019-02-22 ENCOUNTER — Ambulatory Visit: Payer: BC Managed Care – PPO | Admitting: Physical Therapy

## 2019-02-22 VITALS — Temp 97.6°F | Ht 63.0 in | Wt 172.2 lb

## 2019-02-22 DIAGNOSIS — Z1211 Encounter for screening for malignant neoplasm of colon: Secondary | ICD-10-CM

## 2019-02-22 DIAGNOSIS — Z1159 Encounter for screening for other viral diseases: Secondary | ICD-10-CM

## 2019-02-22 MED ORDER — SUPREP BOWEL PREP KIT 17.5-3.13-1.6 GM/177ML PO SOLN: 1.0000 | Freq: Once | ORAL | 0 refills | Status: AC

## 2019-02-22 NOTE — Progress Notes (Signed)
Patient denies any allergies to egg or soy products. Patient denies complications with anesthesia/sedation.  Patient denies oxygen use at home and denies diet medications. Emmi instructions for colonoscopy/endoscopy explained and given to patient.  Suprep coupon given.   

## 2019-02-23 ENCOUNTER — Encounter: Payer: Self-pay | Admitting: Gastroenterology

## 2019-02-23 ENCOUNTER — Encounter: Payer: BC Managed Care – PPO | Admitting: Gastroenterology

## 2019-02-25 ENCOUNTER — Ambulatory Visit (INDEPENDENT_AMBULATORY_CARE_PROVIDER_SITE_OTHER): Payer: BC Managed Care – PPO

## 2019-02-25 DIAGNOSIS — Z1159 Encounter for screening for other viral diseases: Secondary | ICD-10-CM

## 2019-02-25 LAB — SARS CORONAVIRUS 2 (TAT 6-24 HRS): SARS Coronavirus 2: NEGATIVE

## 2019-03-02 ENCOUNTER — Ambulatory Visit (AMBULATORY_SURGERY_CENTER): Payer: BC Managed Care – PPO | Admitting: Gastroenterology

## 2019-03-02 ENCOUNTER — Other Ambulatory Visit: Payer: Self-pay

## 2019-03-02 ENCOUNTER — Encounter: Payer: Self-pay | Admitting: Gastroenterology

## 2019-03-02 VITALS — BP 109/72 | HR 63 | Temp 98.7°F | Resp 19 | Ht 63.0 in | Wt 172.2 lb

## 2019-03-02 DIAGNOSIS — Z1211 Encounter for screening for malignant neoplasm of colon: Secondary | ICD-10-CM

## 2019-03-02 DIAGNOSIS — D123 Benign neoplasm of transverse colon: Secondary | ICD-10-CM | POA: Diagnosis not present

## 2019-03-02 MED ORDER — SODIUM CHLORIDE 0.9 % IV SOLN: 500.0000 mL | Freq: Once | INTRAVENOUS | Status: DC

## 2019-03-02 NOTE — Progress Notes (Signed)
Pt tolerated well. VSS. Awake and to recovery. 

## 2019-03-02 NOTE — Patient Instructions (Signed)
  Handouts on polyps and hemorrhoids given to you today  Await pathology results on polyps removed    YOU HAD AN ENDOSCOPIC PROCEDURE TODAY AT [facility]:   Refer to the procedure report that was given to you for any specific questions about what was found during the examination.  If the procedure report does not answer your questions, please call your gastroenterologist to clarify.  If you requested that your care partner not be given the details of your procedure findings, then the procedure report has been included in a sealed envelope for you to review at your convenience later.  YOU SHOULD EXPECT: Some feelings of bloating in the abdomen. Passage of more gas than usual.  Walking can help get rid of the air that was put into your GI tract during the procedure and reduce the bloating. If you had a lower endoscopy (such as a colonoscopy or flexible sigmoidoscopy) you may notice spotting of blood in your stool or on the toilet paper. If you underwent a bowel prep for your procedure, you may not have a normal bowel movement for a few days.  Please Note:  You might notice some irritation and congestion in your nose or some drainage.  This is from the oxygen used during your procedure.  There is no need for concern and it should clear up in a day or so.  SYMPTOMS TO REPORT IMMEDIATELY:   Following lower endoscopy (colonoscopy or flexible sigmoidoscopy):  Excessive amounts of blood in the stool  Significant tenderness or worsening of abdominal pains  Swelling of the abdomen that is new, acute  Fever of 100F or higher    For urgent or emergent issues, a gastroenterologist can be reached at any hour by calling [phone].   DIET:  We do recommend a small meal at first, but then you may proceed to your regular diet.  Drink plenty of fluids but you should avoid alcoholic beverages for 24 hours.  ACTIVITY:  You should plan to take it easy for the rest of today and you should  NOT DRIVE or use heavy machinery until tomorrow (because of the sedation medicines used during the test).    FOLLOW UP: Our staff will call the number listed on your records 48-72 hours following your procedure to check on you and address any questions or concerns that you may have regarding the information given to you following your procedure. If we do not reach you, we will leave a message.  We will attempt to reach you two times.  During this call, we will ask if you have developed any symptoms of COVID 19. If you develop any symptoms (ie: fever, flu-like symptoms, shortness of breath, cough etc.) before then, please call [phone].  If you test positive for Covid 19 in the 2 weeks post procedure, please call and report this information to [place].    If any biopsies were taken you will be contacted by phone or by letter within the next 1-3 weeks.  Please call us at [phone] if you have not heard about the biopsies in 3 weeks.    SIGNATURES/CONFIDENTIALITY: You and/or your care partner have signed paperwork which will be entered into your electronic medical record.  These signatures attest to the fact that that the information above on your After Visit Summary has been reviewed and is understood.  Full responsibility of the confidentiality of this discharge information lies with you and/or your care-partner.

## 2019-03-02 NOTE — Progress Notes (Signed)
Called to room to assist during endoscopic procedure.  Patient ID and intended procedure confirmed with present staff. Received instructions for my participation in the procedure from the performing physician.  

## 2019-03-02 NOTE — Op Note (Signed)
[facility] Patient Name: [name] Procedure Date: 03/02/2019 7:52 AM MRN: [id number] Endoscopist: Mauri Pole , MD Age: 48 Referring MD:  Date of Birth: Jul 09, 1970 Gender: Female Account #: [id number] Procedure:                Colonoscopy Indications:              Screening for colorectal malignant neoplasm Medicines:                Monitored Anesthesia Care Procedure:                Pre-Anesthesia Assessment:                           - Prior to the procedure, a History and Physical                            was performed, and patient medications and                            allergies were reviewed. The patient's tolerance of                            previous anesthesia was also reviewed. The risks                            and benefits of the procedure and the sedation                            options and risks were discussed with the patient.                            All questions were answered, and informed consent                            was obtained. Prior Anticoagulants: The patient has                            taken no previous anticoagulant or antiplatelet                            agents. ASA Grade Assessment: II - A patient with                            mild systemic disease. After reviewing the risks                            and benefits, the patient was deemed in                            satisfactory condition to undergo the procedure.                           After obtaining informed consent, the colonoscope  was passed under direct vision. Throughout the                            procedure, the patient's blood pressure, pulse, and                            oxygen saturations were monitored continuously. The                            Colonoscope was introduced through the anus and                            advanced to the the cecum, identified by                            appendiceal orifice  and ileocecal valve. The                            colonoscopy was performed without difficulty. The                            patient tolerated the procedure well. The quality                            of the bowel preparation was excellent. The                            ileocecal valve, appendiceal orifice, and rectum                            were photographed. Scope In: 8:13:49 AM Scope Out: 8:26:48 AM Scope Withdrawal Time: 0 hours 10 minutes 35 seconds  Total Procedure Duration: 0 hours 12 minutes 59 seconds  Findings:                 The perianal and digital rectal examinations were                            normal.                           A 4 mm polyp was found in the transverse colon. The                            polyp was sessile. The polyp was removed with a                            cold snare. Resection and retrieval were complete.                           Two sessile polyps were found in the transverse                            colon. The polyps were 1 to 2 mm in size. These  polyps were removed with a cold biopsy forceps.                            Resection and retrieval were complete.                           Non-bleeding internal hemorrhoids were found during                            retroflexion. The hemorrhoids were small.                           The exam was otherwise without abnormality. Complications:            No immediate complications. Estimated Blood Loss:     Estimated blood loss was minimal. Impression:               - One 4 mm polyp in the transverse colon, removed                            with a cold snare. Resected and retrieved.                           - Two 1 to 2 mm polyps in the transverse colon,                            removed with a cold biopsy forceps. Resected and                            retrieved.                           - Non-bleeding internal hemorrhoids.                           - The  examination was otherwise normal. Recommendation:           - Patient has a contact number available for                            emergencies. The signs and symptoms of potential                            delayed complications were discussed with the                            patient. Return to normal activities tomorrow.                            Written discharge instructions were provided to the                            patient.                           - Resume previous diet.                           -  Continue present medications.                           - Await pathology results.                           - Repeat colonoscopy in 3 - 5 years for                            surveillance based on pathology results. Mauri Pole, MD 03/02/2019 8:31:31 AM This report has been signed electronically.

## 2019-03-02 NOTE — Progress Notes (Signed)
Pt's states no medical or surgical changes since previsit or office visit.  Temp-christy  V/s-cw

## 2019-03-04 ENCOUNTER — Telehealth: Payer: Self-pay

## 2019-03-04 NOTE — Telephone Encounter (Signed)
  Follow up Call-  Call back number 03/02/2019  Post procedure Call Back phone  # [phone]  Permission to leave phone message Yes  Some recent data might be hidden     Patient questions:  Do you have a fever, pain , or abdominal swelling? No. Pain Score  0 *  Have you tolerated food without any problems? Yes.    Have you been able to return to your normal activities? Yes.    Do you have any questions about your discharge instructions: Diet   No. Medications  No. Follow up visit  No.  Do you have questions or concerns about your Care? No.  Actions: * If pain score is 4 or above: No action needed, pain <4. 1. Have you developed a fever since your procedure? no  2.   Have you had an respiratory symptoms (SOB or cough) since your procedure? no  3.   Have you tested positive for COVID 19 since your procedure no  4.   Have you had any family members/close contacts diagnosed with the COVID 19 since your procedure?  no   If yes to any of these questions please route to Joylene John, RN and Alphonsa Gin, Therapist, sports.

## 2019-03-05 ENCOUNTER — Ambulatory Visit
Admission: EM | Admit: 2019-03-05 | Discharge: 2019-03-05 | Disposition: A | Discharge status: Home / Self Care | Payer: BC Managed Care – PPO | Attending: Nurse Practitioner | Admitting: Nurse Practitioner

## 2019-03-05 ENCOUNTER — Telehealth: Payer: Self-pay

## 2019-03-05 ENCOUNTER — Encounter: Payer: Self-pay | Admitting: Emergency Medicine

## 2019-03-05 ENCOUNTER — Other Ambulatory Visit: Payer: Self-pay

## 2019-03-05 DIAGNOSIS — Z20828 Contact with and (suspected) exposure to other viral communicable diseases: Secondary | ICD-10-CM | POA: Diagnosis not present

## 2019-03-05 DIAGNOSIS — Z20822 Contact with and (suspected) exposure to covid-19: Secondary | ICD-10-CM

## 2019-03-05 DIAGNOSIS — R5383 Other fatigue: Secondary | ICD-10-CM

## 2019-03-05 LAB — POCT URINALYSIS DIP (MANUAL ENTRY)
Bilirubin, UA: NEGATIVE
Glucose, UA: NEGATIVE mg/dL
Ketones, POC UA: NEGATIVE mg/dL
Leukocytes, UA: NEGATIVE
Nitrite, UA: NEGATIVE
Protein Ur, POC: NEGATIVE mg/dL
Spec Grav, UA: 1.02 (ref 1.010–1.025)
Urobilinogen, UA: 0.2 E.U./dL
pH, UA: 6 (ref 5.0–8.0)

## 2019-03-05 LAB — POC SARS CORONAVIRUS 2 AG -  ED: SARS Coronavirus 2 Ag: NEGATIVE

## 2019-03-05 NOTE — Telephone Encounter (Signed)
Pt said that she took her BP about 1 hr ago 76/56 P 109; now BP 110/83 P 109. Pt feels uneasy, legs feel cold and pt is very tired. pts mouth is also dry. Pt said she might be dehydrated since she has not drank a lot since had colonoscopy on 03/02/19.pt took Amlodipine 10 mg earlier in the day. Pt had H/A this morning but none now. No abd pain, no fever, no weakness or dizziness. Pt will have someone take her to [facility] for eval and treat ([facility] in [place] does do IV fluids if needed.) pt will go now. FYI to Dr Einar Pheasant.

## 2019-03-05 NOTE — ED Provider Notes (Signed)
Roderic Palau    CSN: [id number] Arrival date & time: 03/05/19  1700      History   Chief Complaint Chief Complaint  Patient presents with  . Heart rate issues fatigue    HPI Annalysa Safoa Urbain is a 48 y.o. female.   Subjective:   Shacarra Vivion Haugh is a 48 y.o. female who presents for evaluation of fatigue. Symptoms began 3 days ago. The patient feels the fatigue began one day after undergoing a colonoscopy. Patient reports that the colonoscopy was done for routine screening. She is currently waiting on the results. Symptoms of her fatigue have been anxiousness, diffuse soft tissue aches and pains, chills, general malaise and decreased hydration. Appetite is ok and she's been eating without difficulty but states that she hasn't been drinking very much. Patient describes the following psychological symptoms: stress related to recent death of loved one. Patient denies fevers, sweats, sore throat, cough, shortness of breath, nausea, vomiting, diarrhea, dizziness, headache or change in taste/smell.  Symptoms have waxed and waned. Symptom severity: struggles to carry out day to day responsibilities but is able to do them.  Patient called her PCP office earlier this afternoon to discuss these symptoms. The patient was advised to come here for further evaluation.  Patient denies any known exposure to COVID-19.  No sick contacts within the home.  The following portions of the patient's history were reviewed and updated as appropriate: allergies, current medications, past family history, past medical history, past social history, past surgical history and problem list.       Past Medical History:  Diagnosis Date  . Allergy   . Anemia   . Hypertension   . Uterine fibroid     Patient Active Problem List   Diagnosis Date Noted  . Prediabetes 01/13/2019  . Neck fullness 01/13/2019  . Dyspareunia, female 01/13/2019  . Menopausal vaginal dryness 01/13/2019  . Acne vulgaris  03/04/2018  . 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 Chills without fever 10/31/2017  . Skin tag 10/09/2016  . Malaise 09/07/2015  . Annual physical exam 08/11/2015  . Abnormal uterine bleeding 02/05/2012  . HYPERTENSION, BENIGN ESSENTIAL 01/07/2007  . Anemia, iron deficiency 05/15/2006    Past Surgical History:  Procedure Laterality Date  . CESAREAN SECTION     x 3  . TUBAL LIGATION      OB History   No obstetric history on file.      Home Medications    Prior to Admission medications   Medication Sig Start Date End Date Taking? Authorizing Provider  amLODipine (NORVASC) 10 MG tablet Take 1 tablet (10 mg total) by mouth daily. 03/04/18   Lesleigh Noe, MD  conjugated estrogens (PREMARIN) vaginal cream Place [id number] Applicatorfuls vaginally 2 (two) times a week. Patient not taking: Reported on 02/22/2019 01/14/19   Lesleigh Noe, MD  Multiple Vitamin (MULTIVITAMIN) tablet Take 1 tablet by mouth daily.    [provider]  NON FORMULARY     [provider]    Family History Family History  Problem Relation Age of Onset  . Diabetes Mother   . Hypertension Mother   . Stroke Mother 89  . Heart failure Father        s/p heart transplant  . Leukemia Sister   . Hypertension Brother   . Pancreatic cancer Paternal Grandmother   . Colon cancer Neg Hx   . Rectal cancer Neg Hx   . Stomach cancer Neg Hx     Social  History Social History   Tobacco Use  . Smoking status: Never Smoker  . Smokeless tobacco: Never Used  Substance Use Topics  . Alcohol use: No  . Drug use: No     Allergies   Vicodin [hydrocodone-acetaminophen]   Review of Systems Review of Systems  Constitutional: Positive for fatigue. Negative for fever.  HENT: Negative.   Respiratory: Negative.   Gastrointestinal: Negative.   Musculoskeletal: Positive for myalgias.  Neurological: Negative.   All other systems reviewed and are negative.    Physical Exam Triage Vital  Signs ED Triage Vitals  Enc Vitals Group     BP 03/05/19 1712 125/83     Pulse Rate 03/05/19 1712 79     Resp 03/05/19 1712 20     Temp 03/05/19 1712 99 F (37.2 C)     Temp Source 03/05/19 1712 Oral     SpO2 03/05/19 1712 98 %     Weight 03/05/19 1710 172 lb (78 kg)     Height --      Head Circumference --      Peak Flow --      Pain Score --      Pain Loc --      Pain Edu? --      Excl. in [place]? --    No data found.  Updated Vital Signs BP 125/83   Pulse 79   Temp 99 F (37.2 C) (Oral)   Resp 20   Wt 172 lb (78 kg)   LMP 12/06/2014 (LMP Unknown)   SpO2 98%   BMI 30.47 kg/m   Visual Acuity Right Eye Distance:   Left Eye Distance:   Bilateral Distance:    Right Eye Near:   Left Eye Near:    Bilateral Near:     Physical Exam Vitals reviewed.  Constitutional:      Appearance: Normal appearance.  HENT:     Head: Normocephalic.  Cardiovascular:     Rate and Rhythm: Normal rate and regular rhythm.  Pulmonary:     Effort: Pulmonary effort is normal.     Breath sounds: Normal breath sounds.  Musculoskeletal:        General: Normal range of motion.     Cervical back: Normal range of motion and neck supple.  Skin:    General: Skin is warm and dry.  Neurological:     General: No focal deficit present.     Mental Status: She is alert and oriented to person, place, and time.     Cranial Nerves: Cranial nerves are intact.     Sensory: Sensation is intact.     Motor: Motor function is intact.     Coordination: Coordination is intact.     Gait: Gait is intact.  Psychiatric:        Mood and Affect: Mood normal.        Behavior: Behavior normal.        Thought Content: Thought content normal.      UC Treatments / Results  Labs (all labs ordered are listed, but only abnormal results are displayed) Labs Reviewed  POCT URINALYSIS DIP (MANUAL ENTRY) - Abnormal; Notable for the following components:      Result Value   Clarity, UA cloudy (*)    Blood, UA  trace-lysed (*)    All other components within normal limits  POC SARS CORONAVIRUS 2 AG -  ED    EKG   Radiology No results found.  Procedures Procedures (including critical  care time)  Medications Ordered in UC Medications - No data to display  Initial Impression / Assessment and Plan / UC Course  I have reviewed the triage vital signs and the nursing notes.  Pertinent labs & imaging results that were available during my care of the patient were reviewed by me and considered in my medical decision making (see chart for details).    47 year old female presenting with a 3-day history of fatigue after undergoing a colonoscopy.  She also endorses anxiousness, diffuse soft tissue aches and pains, chills, general malaise and decreased hydration.  Notably, her father recently died and she has had stress related to that.  Patient is alert and oriented x3.  She has a low-grade fever, all other vitals are within normal parameters.  Urinalysis does not show any indication of acute dehydration. Rapid COVID negative. Discussed diagnosis with patient. Reassured that serious underlying cause for the fatigue is very unlikely. Discussed lifestyle modification as means of resolving problem.  Today's evaluation has revealed no signs of a dangerous process. Discussed diagnosis with patient and/or guardian. Patient and/or guardian aware of their diagnosis, possible red flag symptoms to watch out for and need for close follow up. Patient and/or guardian understands verbal and written discharge instructions. Patient and/or guardian comfortable with plan and disposition.  Patient and/or guardian has a clear mental status at this time, good insight into illness (after discussion and teaching) and has clear judgment to make decisions regarding their care  This care was provided during an unprecedented National Emergency due to the Novel Coronavirus (COVID-19) pandemic. COVID-19 infections and transmission risks  place heavy strains on healthcare resources.  As this pandemic evolves, our facility, providers, and staff strive to respond fluidly, to remain operational, and to provide care relative to available resources and information. Outcomes are unpredictable and treatments are without well-defined guidelines. Further, the impact of COVID-19 on all aspects of urgent care, including the impact to patients seeking care for reasons other than COVID-19, is unavoidable during this national emergency. At this time of the global pandemic, management of patients has significantly changed, even for non-COVID positive patients given high local and regional COVID volumes at this time requiring high healthcare system and resource utilization. The standard of care for management of both COVID suspected and non-COVID suspected patients continues to change rapidly at the local, regional, national, and global levels. This patient was worked up and treated to the best available but ever changing evidence and resources available at this current time.   Documentation was completed with the aid of voice recognition software. Transcription may contain typographical errors.   Final Clinical Impressions(s) / UC Diagnoses   Final diagnoses:  Fatigue, unspecified type  Encounter for screening laboratory testing for COVID-19 virus     Discharge Instructions      Avoid heavy meals in the evening.  Eat a well-balanced diet, which includes lean proteins, whole grains, plenty of fruits and vegetables, and low-fat dairy products.  Avoid consuming too much caffeine.  Avoid the use of alcohol.  Drink enough fluids to keep your urine pale yellow.    ED Prescriptions    None     PDMP not reviewed this encounter.   Enrique Sack, [place] 03/05/19 1801

## 2019-03-05 NOTE — ED Triage Notes (Signed)
Patient in office today c/o fatigue and heart rate issues since 03/03/19  [id number]

## 2019-03-05 NOTE — Discharge Instructions (Signed)
Avoid heavy meals in the evening. Eat a well-balanced diet, which includes lean proteins, whole grains, plenty of fruits and vegetables, and low-fat dairy products. Avoid consuming too much caffeine. Avoid the use of alcohol. Drink enough fluids to keep your urine pale yellow.

## 2019-03-08 NOTE — Telephone Encounter (Signed)
Agree with plan. Urgent care with stable VS.

## 2019-03-10 ENCOUNTER — Encounter: Payer: Self-pay | Admitting: Gastroenterology

## 2019-03-18 ENCOUNTER — Encounter: Payer: Self-pay | Admitting: Family Medicine

## 2019-03-18 ENCOUNTER — Ambulatory Visit (INDEPENDENT_AMBULATORY_CARE_PROVIDER_SITE_OTHER): Payer: BC Managed Care – PPO | Admitting: Family Medicine

## 2019-03-18 ENCOUNTER — Ambulatory Visit: Payer: Self-pay | Admitting: *Deleted

## 2019-03-18 ENCOUNTER — Other Ambulatory Visit: Payer: Self-pay

## 2019-03-18 VITALS — BP 102/70 | HR 77 | Temp 97.7°F | Resp 14 | Wt 164.2 lb

## 2019-03-18 DIAGNOSIS — R011 Cardiac murmur, unspecified: Secondary | ICD-10-CM

## 2019-03-18 DIAGNOSIS — R0789 Other chest pain: Secondary | ICD-10-CM | POA: Diagnosis not present

## 2019-03-18 DIAGNOSIS — R29898 Other symptoms and signs involving the musculoskeletal system: Secondary | ICD-10-CM

## 2019-03-18 NOTE — Assessment & Plan Note (Signed)
EKG reassuring. Recent mammogram which was normal for the left breast. Labs today and echo. If persisting will plan cardiology referral.

## 2019-03-18 NOTE — Telephone Encounter (Signed)
This pt was referred to be triaged from [facility].  She called in c/o having a "nagging, uncomfortable pain under her left breast".   "It feels like my heart skips beats sometimes and my breath catches".   "It's not sharp but just nagging".    See triage notes.  I went over the care advice when to call 911 and pt verbalized understanding.  The protocol was ED or PCP triage so I warm transferred the call into the office to [facility].  I sent my notes to the office.   Reason for Disposition . [1] Chest pain lasts > 5 minutes AND [2] occurred in past 3 days (72 hours)  Answer Assessment - Initial Assessment Questions 1. LOCATION: "Where does it hurt?"       I'm feeling uncomfortable  in my chest.   I feel like my heart is skipping beats.   I feel like my breath gets cut off.    No sharp pain.   Just a nagging pain in my left chest under my breast.    I'm having headaches also.   It started last week.   I called last week and I was referred to the urgent care.   So I went to urgent care.   They checked my urine and said I was not dehydrated.   My COVID test was negative.   I was having these same symptoms when I went to the urgent care.     They did not do an EKG on me.   2. RADIATION: "Does the pain go anywhere else?" (e.g., into neck, jaw, arms, back)     No 3. ONSET: "When did the chest pain begin?" (Minutes, hours or days)      It started a couple of days after I had a colonoscopy.   That's why they thought I may be dehydrated and sent me to the urgent care because the office was closing. 4. PATTERN "Does the pain come and go, or has it been constant since it started?"  "Does it get worse with exertion?"      It comes and goes. 5. DURATION: "How long does it last" (e.g., seconds, minutes, hours)     It was a nagging pain, uncomfortable for a while more than an hour. 6. SEVERITY: "How bad is the pain?"  (e.g., Scale 1-10; mild, moderate, or severe)    - MILD (1-3): doesn't  interfere with normal activities     - MODERATE (4-7): interferes with normal activities or awakens from sleep    - SEVERE (8-10): excruciating pain, unable to do any normal activities       4-5 on scale 7. CARDIAC RISK FACTORS: "Do you have any history of heart problems or risk factors for heart disease?" (e.g., angina, prior heart attack; diabetes, high blood pressure, high cholesterol, smoker, or strong family history of heart disease)     Hypertension but it's controlled.   115/82 my BP stays around that.   My pulse is around 77.  8. PULMONARY RISK FACTORS: "Do you have any history of lung disease?"  (e.g., blood clots in lung, asthma, emphysema, birth control pills)     Not a smoker, no blood clots.  9. CAUSE: "What do you think is causing the chest pain?"     I don't know.       10. OTHER SYMPTOMS: "Do you have any other symptoms?" (e.g., dizziness, nausea, vomiting, sweating, fever, difficulty breathing, cough)  Sometimes my legs feel heavy.   Sometimes I feel like I need to catch my breath but my O2 saturation is 97 %.    11. PREGNANCY: "Is there any chance you are pregnant?" "When was your last menstrual period?"       N/A due to age  Protocols used: CHEST PAIN-A-AH

## 2019-03-18 NOTE — Assessment & Plan Note (Signed)
Normal strength b/l. Wonder if this is just prolonged recovery following colonoscopy. Labs today. Continue to monitor. Reassuring exam

## 2019-03-18 NOTE — Patient Instructions (Addendum)
1) Stop taking the amlodipine and monitor blood pressure  2) Labs today  3) Echo to look at heart

## 2019-03-18 NOTE — Telephone Encounter (Signed)
OK for appointment in the clinic

## 2019-03-18 NOTE — Telephone Encounter (Signed)
Spoke with patient and appointment made

## 2019-03-18 NOTE — Telephone Encounter (Addendum)
Lucinda with [facility] transferred pt to me; pt said she has had dull pain under left breast which continues since 03/05/19 feels like air cuts off and heart skips beat which is a new symptom. Pt said not having these symptoms right now. Pt has chills but does not think she has a fever,lt shoulder pain that is sharp and hurts when moves arm; does not think related to CP. Pt has H/A at rt forehead and lt side of head now. Rash around neck that started 03/15/19 and pt continues with fatigue. Pt wants to come to office for appt because when seen UC on 03/05/19 they focused on fatigue and whether pt was dehydrated. Pt had neg covid on 03/05/19. Dr Einar Pheasant is with pt and Anastasiya CMA will speak with Dr Einar Pheasant and call pt back about whether pt can come in for appt or will pt need to go to [facility] or ED. UC & ED precautions given and pt voiced understanding.

## 2019-03-18 NOTE — Progress Notes (Signed)
Subjective:     [name] is a 48 y.o. female presenting for Chest Pain (off and on, sometimes irregular heart beat. No symptoms today.)     Chest Pain  This is a new problem. The current episode started 1 to 4 weeks ago (after colonoscopy). The onset quality is gradual. The problem occurs constantly (initially, but less often now). The problem has been waxing and waning. The pain is present in the lateral region (over the left breast). The pain is mild. The quality of the pain is described as pressure (nagging pain). The pain does not radiate. Associated symptoms include headaches, irregular heartbeat, leg pain and malaise/fatigue. Pertinent negatives include no abdominal pain, fever, nausea, near-syncope, palpitations, shortness of breath, syncope or vomiting. The pain is aggravated by nothing. She has tried rest for the symptoms. The treatment provided mild relief. There are no known risk factors.    BP is low Will only take medication when her BP is high    #Legs/Feet  - feel heavy  - feels tired - no back pain - no swelling in the legs - legs get heavy when walking - also started since colonoscopy  Has been drinking ginger, garlic, tumeric   Review of Systems  Constitutional: Positive for chills (gets them once a year) and malaise/fatigue. Negative for fever.  Respiratory: Negative for shortness of breath.   Cardiovascular: Positive for chest pain. Negative for palpitations, syncope and near-syncope.  Gastrointestinal: Negative for abdominal pain, nausea and vomiting.  Neurological: Positive for headaches.     Social History   Tobacco Use  Smoking Status Never Smoker  Smokeless Tobacco Never Used        Objective:    BP Readings from Last 3 Encounters:  03/18/19 102/70  03/05/19 125/83  03/02/19 109/72   Wt Readings from Last 3 Encounters:  03/18/19 164 lb 4 oz (74.5 kg)  03/05/19 172 lb (78 kg)  03/02/19 172 lb 3.2 oz (78.1 kg)    BP  102/70   Pulse 77   Temp 97.7 F (36.5 C)   Resp 14   Wt 164 lb 4 oz (74.5 kg)   LMP 12/06/2014 (LMP Unknown)   SpO2 99%   BMI 29.10 kg/m    Physical Exam Constitutional:      General: She is not in acute distress.    Appearance: She is well-developed. She is not diaphoretic.  HENT:     Head: Normocephalic and atraumatic.     Right Ear: External ear normal.     Left Ear: External ear normal.     Nose: Nose normal.  Eyes:     Conjunctiva/sclera: Conjunctivae normal.  Neck:     Thyroid: No thyromegaly.  Cardiovascular:     Rate and Rhythm: Normal rate and regular rhythm.     Heart sounds: Murmur present. Systolic murmur present.  Pulmonary:     Effort: Pulmonary effort is normal. No respiratory distress.     Breath sounds: Normal breath sounds.  Chest:     Chest wall: No mass, deformity, tenderness or crepitus.  Musculoskeletal:     Cervical back: Neck supple.     Right lower leg: No tenderness. No edema.     Left lower leg: No tenderness. No edema.     Comments: Normal LE strength b/l.   Skin:    General: Skin is warm and dry.     Capillary Refill: Capillary refill takes less than 2 seconds.  Neurological:  Mental Status: She is alert. Mental status is at baseline.     Motor: No weakness.     Deep Tendon Reflexes:     Reflex Scores:      Patellar reflexes are 2+ on the right side and 2+ on the left side.      Achilles reflexes are 2+ on the right side and 2+ on the left side. Psychiatric:        Mood and Affect: Mood normal.        Behavior: Behavior normal.      EKG: NSR, no ST changes, no T-wave abnormalities     Assessment & Plan:   Problem List Items Addressed This Visit      Other   Other chest pain - Primary    EKG reassuring. Recent mammogram which was normal for the left breast. Labs today and echo. If persisting will plan cardiology referral.       Relevant Orders   EKG 12-Lead (Completed)   ECHOCARDIOGRAM COMPLETE   B12    Comprehensive metabolic panel   CBC   Murmur    Given CP and fatigue will get ECHO to evaluate murmur.       Relevant Orders   ECHOCARDIOGRAM COMPLETE   B12   Comprehensive metabolic panel   CBC   Fatigue of lower extremity    Normal strength b/l. Wonder if this is just prolonged recovery following colonoscopy. Labs today. Continue to monitor. Reassuring exam      Relevant Orders   B12   Comprehensive metabolic panel   CBC       Return in about 4 weeks (around 04/15/2019).  Lesleigh Noe, MD

## 2019-03-18 NOTE — Assessment & Plan Note (Signed)
Given CP and fatigue will get ECHO to evaluate murmur.

## 2019-03-19 LAB — CBC
HCT: 36.3 % (ref 35.0–45.0)
Hemoglobin: 11.9 g/dL (ref 11.7–15.5)
MCH: 28.7 pg (ref 27.0–33.0)
MCHC: 32.8 g/dL (ref 32.0–36.0)
MCV: 87.5 fL (ref 80.0–100.0)
MPV: 13.3 fL — ABNORMAL HIGH (ref 7.5–12.5)
Platelets: 195 10*3/uL (ref 140–400)
RBC: 4.15 10*6/uL (ref 3.80–5.10)
RDW: 13 % (ref 11.0–15.0)
WBC: 5 10*3/uL (ref 3.8–10.8)

## 2019-03-19 LAB — COMPREHENSIVE METABOLIC PANEL
AG Ratio: 1.4 (calc) (ref 1.0–2.5)
ALT: 13 U/L (ref 6–29)
AST: 14 U/L (ref 10–35)
Albumin: 4.3 g/dL (ref 3.6–5.1)
Alkaline phosphatase (APISO): 70 U/L (ref 37–153)
BUN: 13 mg/dL (ref 7–25)
CO2: 22 mmol/L (ref 20–32)
Calcium: 9.6 mg/dL (ref 8.6–10.4)
Chloride: 109 mmol/L (ref 98–110)
Creat: 0.84 mg/dL (ref 0.50–1.05)
Globulin: 3.1 g/dL (calc) (ref 1.9–3.7)
Glucose, Bld: 113 mg/dL — ABNORMAL HIGH (ref 65–99)
Potassium: 4 mmol/L (ref 3.5–5.3)
Sodium: 141 mmol/L (ref 135–146)
Total Bilirubin: 0.5 mg/dL (ref 0.2–1.2)
Total Protein: 7.4 g/dL (ref 6.1–8.1)

## 2019-03-19 LAB — VITAMIN B12: Vitamin B-12: 1242 pg/mL — ABNORMAL HIGH (ref 200–1100)

## 2019-03-25 ENCOUNTER — Other Ambulatory Visit: Payer: Self-pay

## 2019-03-25 ENCOUNTER — Ambulatory Visit (HOSPITAL_COMMUNITY): Payer: BC Managed Care – PPO | Attending: Cardiology

## 2019-03-25 DIAGNOSIS — R011 Cardiac murmur, unspecified: Secondary | ICD-10-CM | POA: Diagnosis not present

## 2019-03-25 DIAGNOSIS — R0789 Other chest pain: Secondary | ICD-10-CM | POA: Insufficient documentation

## 2019-03-26 ENCOUNTER — Telehealth: Payer: Self-pay

## 2019-03-26 DIAGNOSIS — R0789 Other chest pain: Secondary | ICD-10-CM

## 2019-03-26 NOTE — Telephone Encounter (Signed)
Patient advised.

## 2019-03-26 NOTE — Telephone Encounter (Signed)
Please let patient know that the ECHO was normal.   Her heart has normal functioning.   There was one heart valve which could not be fully seen, but all the other valves were functioning normally.   We discussed in our visit that if her chest pain is persisting that I would place a referral to cardiology.   She could watch and wait, but the referral has been placed if she would like to see them to discuss additional options.

## 2019-03-26 NOTE — Telephone Encounter (Signed)
Patient advised of everything. Patient states chest pain has resolved so far. Fatigue and heavy feeling has improved. She still gets chills, headache, now some back pain but no fever. No urinary issues.  Her b/p has been up and down some days around 140/90 and 130/90. When she has readings over 130/90 she has been taking Amlodipine 5 mg, she had to do this 2 times since her last visit 03/18/2019. Otherwise she does not take medications for B/P. Recommendations?  She is also worried because her weight has gone down but states she has also been eating healthier since her last visit. At her last visit she weighed 164 lbs per our scale but on that day at home she weighed 160lb per her scale and as of today weighs 155lb per her scale.

## 2019-03-26 NOTE — Telephone Encounter (Signed)
Would recommend taking Amlodipine 5 mg daily unless blood pressure prior to taking is <110/70 or feeling lightheaded or dizzy.   I'm glad many of they symptoms are improving and that she is eating healthy.   Continue to monitor weight at home.   If her symptoms worsen or she develops new symptoms, I would recommend an earlier appointment. Otherwise we can check back in at the end of the month as scheduled.

## 2019-03-26 NOTE — Telephone Encounter (Signed)
Patient left message on triage line asking for results of her echo that was done yesterday. Patient states she is worried and wants to know what is going on. Please review.

## 2019-03-31 ENCOUNTER — Encounter: Payer: Self-pay | Admitting: Interventional Cardiology

## 2019-03-31 ENCOUNTER — Other Ambulatory Visit: Payer: Self-pay

## 2019-03-31 ENCOUNTER — Ambulatory Visit (INDEPENDENT_AMBULATORY_CARE_PROVIDER_SITE_OTHER): Payer: BC Managed Care – PPO | Admitting: Interventional Cardiology

## 2019-03-31 VITALS — BP 98/68 | HR 93 | Ht 63.0 in | Wt 161.8 lb

## 2019-03-31 DIAGNOSIS — Z01812 Encounter for preprocedural laboratory examination: Secondary | ICD-10-CM

## 2019-03-31 DIAGNOSIS — I10 Essential (primary) hypertension: Secondary | ICD-10-CM

## 2019-03-31 DIAGNOSIS — R072 Precordial pain: Secondary | ICD-10-CM

## 2019-03-31 MED ORDER — METOPROLOL TARTRATE 50 MG PO TABS: ORAL_TABLET | ORAL | 0 refills | Status: DC

## 2019-03-31 NOTE — Patient Instructions (Addendum)
Medication Instructions:  Your physician recommends that you continue on your current medications as directed. Please refer to the Current Medication list given to you today.  If you need a refill on your cardiac medications before your next appointment, please call your pharmacy.   Lab work: Your physician recommends that you return for lab work Artist) prior to Cardiac CT  If you have labs (blood work) drawn today and your tests are completely normal, you will receive your results only by: Marland Kitchen MyChart Message (if you have MyChart) OR . A paper copy in the mail If you have any lab test that is abnormal or we need to change your treatment, we will call you to review the results.  Testing/Procedures: Your physician has requested that you have cardiac CT. Cardiac computed tomography (CT) is a painless test that uses an x-ray machine to take clear, detailed pictures of your heart. For further information please visit [email or web address]. Please follow instruction sheet as given.  Follow-Up: . Based on results  Any Other Special Instructions Will Be Listed Below (If Applicable).  CARDIAC CT INSTRUCTIONS  Your cardiac CT will be scheduled at one of the below locations:   [facility] [address] [place], [place] [address] (336) [place] [address] [place], [place] [address] [phone]  If scheduled at [facility], please arrive at the [facility] main entrance of [facility] 30-45 minutes prior to test start time. Proceed to the [facility] Radiology Department (first floor) to check-in and test prep.  If scheduled at [facility] please arrive 15 mins early for check-in and test prep.  Please follow these instructions carefully (unless otherwise directed):   On the Night Before the Test: . Be sure to Drink plenty of water. . Do not consume any  caffeinated/decaffeinated beverages or chocolate 12 hours prior to your test. . Do not take any antihistamines 12 hours prior to your test.   On the Day of the Test: . Drink plenty of water. Do not drink any water within one hour of the test. . Do not eat any food 4 hours prior to the test. . You may take your regular medications prior to the test.  . Take metoprolol (Lopressor) 50 MG two hours prior to test. . FEMALES- please wear underwire-free bra if available    After the Test: . Drink plenty of water. . After receiving IV contrast, you may experience a mild flushed feeling. This is normal. . On occasion, you may experience a mild rash up to 24 hours after the test. This is not dangerous. If this occurs, you can take Benadryl 25 mg and increase your fluid intake. . If you experience trouble breathing, this can be serious. If it is severe call 911 IMMEDIATELY. If it is mild, please call our office.   Once we have confirmed authorization from your insurance company, we will call you to set up a date and time for your test.   For non-scheduling related questions, please contact the cardiac imaging nurse navigator should you have any questions/concerns: Marchia Bond, RN Navigator Cardiac Imaging [facility] [phone] Office

## 2019-03-31 NOTE — Progress Notes (Signed)
Cardiology Office Note   Date:  03/31/2019   ID:  [name] 03/23/1970, MRN [id number]  PCP:  Tammie Noe, MD    No chief complaint on file.  Chest pain  Wt Readings from Last 3 Encounters:  03/31/19 161 lb 12.8 oz (73.4 kg)  03/18/19 164 lb 4 oz (74.5 kg)  03/05/19 172 lb (78 kg)       History of Present Illness: [name] is a 49 y.o. female who is being seen today for the evaluation of chest pain at the request of Tammie Noe, MD.  Her father passed away three weeks ago.  She has been anxious since then and the chest pain started.  Her father had a heart transplant 14 years ago at [name], for what sounds like NICM, and then he had COVID.    She had an echo on Jan 7 showing: "Left ventricular ejection fraction, by visual estimation, is 60 to 65%. The left ventricle has normal function. There is no left ventricular hypertrophy. Normal diastolic function.  2. The average left ventricular global longitudinal strain is -18.2 %.  3. Global right ventricle has normal systolic function.The right ventricular size is normal.  4. Left atrial size was normal.  5. Right atrial size was normal.  6. The mitral valve is normal in structure. No evidence of mitral valve regurgitation.  7. The tricuspid valve is normal in structure.  8. The aortic valve is tricuspid. Aortic valve regurgitation is not visualized. No evidence of aortic valve sclerosis or stenosis.  9. The pulmonic valve was not well visualized. Pulmonic valve regurgitation is not visualized. 10. Normal pulmonary artery systolic pressure."  She has felt better after the echo.  BP at home has been in the 120-130/90-95 range.  It was lower at MD office, and meds were stopped.  She is a Marine scientist, but on a leave of absence.  ECG on 12/31 was normal, HR 71.   She walks up stairs regularly.  She has some fatigue in her legs.      Past Medical History:  Diagnosis Date  . Allergy   . Anemia     . Hypertension   . Uterine fibroid     Past Surgical History:  Procedure Laterality Date  . CESAREAN SECTION     x 3  . TUBAL LIGATION       Current Outpatient Medications  Medication Sig Dispense Refill  . amLODipine (NORVASC) 10 MG tablet Take 1 tablet (10 mg total) by mouth daily. 90 tablet 3  . Multiple Vitamin (MULTIVITAMIN) tablet Take 1 tablet by mouth daily.    . NON FORMULARY      No current facility-administered medications for this visit.    Allergies:   Vicodin [hydrocodone-acetaminophen]    Social History:  The patient  reports that she has never smoked. She has never used smokeless tobacco. She reports that she does not drink alcohol or use drugs.   Family History:  The patient's family history includes Diabetes in her mother; Heart failure in her father; Hypertension in her brother and mother; Leukemia in her sister; Pancreatic cancer in her paternal grandmother; Stroke (age of onset: 61) in her mother.    ROS:  Please see the history of present illness.   Otherwise, review of systems are positive for stress/chest pain.   All other systems are reviewed and negative.    PHYSICAL EXAM: VS:  BP 98/68   Pulse 93  Ht 5\' 3"  (1.6 m)   Wt 161 lb 12.8 oz (73.4 kg)   LMP 12/06/2014 (LMP Unknown)   SpO2 99%   BMI 28.66 kg/m  , BMI Body mass index is 28.66 kg/m. GEN: Well nourished, well developed, in no acute distress  HEENT: normal  Neck: no JVD, carotid bruits, or masses Cardiac: RRR; no murmurs, rubs, or gallops,no edema  Respiratory:  clear to auscultation bilaterally, normal work of breathing GI: soft, nontender, nondistended, + BS MS: no deformity or atrophy  Skin: warm and dry, no rash Neuro:  Strength and sensation are intact Psych: euthymic mood, full affect   EKG:   The ekg ordered 12/31 demonstrates normal ECG   Recent Labs: 01/13/2019: TSH 1.28 03/18/2019: ALT 13; BUN 13; Creat 0.84; Hemoglobin 11.9; Platelets 195; Potassium 4.0; Sodium  141   Lipid Panel    Component Value Date/Time   CHOL 139 01/13/2019 1254   CHOL 133 10/03/2016 1356   TRIG 70.0 01/13/2019 1254   HDL 45.40 01/13/2019 1254   HDL 49 10/03/2016 1356   CHOLHDL 3 01/13/2019 1254   VLDL 14.0 01/13/2019 1254   LDLCALC 80 01/13/2019 1254   LDLCALC 71 10/03/2016 1356     Other studies Reviewed: Additional studies/ records that were reviewed today with results demonstrating: echo reviewed- normal EF.   ASSESSMENT AND PLAN:  1. Chest pain: some typical and some atypical features.  May be stress related given her father's death.  Given his h/o heart transplant, would be helpful to see if she has any CAD.  Plan for coronary CTA.  Would give metoprolol 50 mg morning of CT scan.. 2. LDL 80 in 12/2018. Continue healthy diet.  3. Weight loss- likely from stress.  Stay well hydrated.    Current medicines are reviewed at length with the patient today.  The patient concerns regarding her medicines were addressed.  The following changes have been made:  No change  Labs/ tests ordered today include:  No orders of the defined types were placed in this encounter.   Recommend 150 minutes/week of aerobic exercise Low fat, low carb, high fiber diet recommended  Disposition:   FU based on test results   Signed, Larae Grooms, MD  03/31/2019 4:05 PM    [name], [name], [name]  [id number] Phone: [phone]; Fax: [phone]

## 2019-04-06 ENCOUNTER — Telehealth: Payer: Self-pay | Admitting: *Deleted

## 2019-04-06 DIAGNOSIS — U071 COVID-19: Secondary | ICD-10-CM | POA: Diagnosis not present

## 2019-04-06 NOTE — Telephone Encounter (Signed)
Agree with advice

## 2019-04-06 NOTE — Telephone Encounter (Signed)
Patient called stating that she does not know if she has the flu or covid. Patient stated that she started with a cough, itchy throat and body aches yesterday. Patient denies a fever, SOB or difficulty breathing. Patient was offered a virtual visit today with Dr. Einar Pheasant which she declined stating that she just wants to go somewhere and be tested. Patient stated that she did bury her dad Saturday and  was around some people, but everyone was wearing a mask. Patient was given the information to reach out to [facility] for covid testing. Patient stated that she will go be tested. Patient was advised that she needs to quarantine until her test results come back. ER precautions were given to patient and she verbalized understanding.

## 2019-04-12 ENCOUNTER — Telehealth: Payer: Self-pay | Admitting: *Deleted

## 2019-04-12 NOTE — Telephone Encounter (Signed)
Patient called stating that she did test positive for covid . Patient stated that she has a fever of .99.4 with tylenol. Patient stated that she has a cough, but denies SOB or difficulty breathing. Patient stated that she wants to know what she can take over the counter for her cough and scratchy throat?  Patient stated that she has an appointment with Dr. Einar Pheasant Thursday for a follow-up and she will still be in quarantine. Patient wants to know if she should reschedule that appointment or change it to a virtual? Patient requested a call back regarding OTC   for her symptoms.

## 2019-04-12 NOTE — Telephone Encounter (Signed)
I recommend the following for Cold treatment  Cough 1) Cough drops can be helpful 2) Nyquil (or nighttime cough medication) 3) Honey is proven to be one of the best cough medications  4) Cough medicine with Dextromethorphan can also be helpful  Sore Throat 1) Honey as above, cough drops 2) Ibuprofen or Aleve can be helpful 3) Salt water Gargles   Follow-up was for chest pain. Would recommend deferring appointment until no longer in quarantine. She has also seen cardiology so if she does not have additional questions, she could plan a later follow-up.

## 2019-04-12 NOTE — Telephone Encounter (Signed)
I called and spoke with the patient and informed her of the providers recommendations for her cough and sore throat while home. I also informed patient that I would cancel her f/up until she is out of quarantine, I asked her to please call back to reschedule at a later date and the patient understood.  England Greb,cma

## 2019-04-15 ENCOUNTER — Ambulatory Visit: Payer: BC Managed Care – PPO | Admitting: Family Medicine

## 2019-04-16 ENCOUNTER — Telehealth: Payer: Self-pay

## 2019-04-16 NOTE — Telephone Encounter (Signed)
Agree with following up next week.

## 2019-04-16 NOTE — Telephone Encounter (Signed)
Patient called stating that has developed a pain in the left calf about 3 to 4 days ago and that resolved a day ago or so. Then yesterday-04/15/2019, with sitting and laying down she noticed pain around/behind her left knee. She is not having any swelling, no warmth or redness to the area. No injury that she is aware of. No pain in the calf area anymore. No pain with walking or standing. It is not painful enough to go to ER but will go to urgent care in [place] since we do not have any appointments. She will update [place] next week if she is still having any issues or concerns.  FYI to PCP

## 2019-04-19 ENCOUNTER — Telehealth: Payer: Self-pay | Admitting: *Deleted

## 2019-04-19 NOTE — Telephone Encounter (Signed)
Would recommend appointment to discuss

## 2019-04-19 NOTE — Telephone Encounter (Signed)
Patient called stating that she still has a itchy nose and a dry cough. Patient stated that she is blowing her nose, but not getting anything out. Patient stated that her nose feels so bad.Patient stated that she thinks that she needs an antibiotic. Patient stated that she does not have a fever. Pharmacy [facility]

## 2019-04-20 ENCOUNTER — Ambulatory Visit (INDEPENDENT_AMBULATORY_CARE_PROVIDER_SITE_OTHER): Payer: BC Managed Care – PPO | Admitting: Family Medicine

## 2019-04-20 ENCOUNTER — Encounter: Payer: Self-pay | Admitting: Family Medicine

## 2019-04-20 VITALS — BP 119/81 | HR 90 | Temp 97.7°F | Wt 155.0 lb

## 2019-04-20 DIAGNOSIS — U071 COVID-19: Secondary | ICD-10-CM | POA: Diagnosis not present

## 2019-04-20 NOTE — Progress Notes (Signed)
I connected with [name] on 04/20/19 at 11:00 AM EST by video and verified that I am speaking with the correct person using two identifiers.   I discussed the limitations, risks, security and privacy concerns of performing an evaluation and management service by video and the availability of in person appointments. I also discussed with the patient that there may be a patient responsible charge related to this service. The patient expressed understanding and agreed to proceed.  Patient location: Home Provider Location: [facility] Participants: Lesleigh Noe and Ambrose Mantle   Subjective:     [name] is a 49 y.o. female presenting for Headache (sore throat off and on. temp is up and down. )     Sinusitis This is a new problem. The current episode started 1 to 4 weeks ago. The problem has been gradually improving since onset. The maximum temperature recorded prior to her arrival was 101 - 101.9 F. The fever has been present for 1 to 2 days. Associated symptoms include congestion, coughing (dry), headaches (in the afternoon typically), sinus pressure (at night) and a sore throat. Pertinent negatives include no chills, ear pain or shortness of breath. Past treatments include acetaminophen (drinking fluids, nyquil). The treatment provided mild relief.   Symptoms started on 04/05/2019 and tested positive for covid on 04/06/2019  Initially had fevers, but last temperature was last week 101 x 1 day which was bad  Review of Systems  Constitutional: Negative for chills.  HENT: Positive for congestion, sinus pressure (at night) and sore throat. Negative for ear pain.   Respiratory: Positive for cough (dry). Negative for shortness of breath.   Neurological: Positive for headaches (in the afternoon typically).     Social History   Tobacco Use  Smoking Status Never Smoker  Smokeless Tobacco Never Used        Objective:   BP Readings from Last  3 Encounters:  04/20/19 119/81  03/31/19 98/68  03/18/19 102/70   Wt Readings from Last 3 Encounters:  04/20/19 155 lb (70.3 kg)  03/31/19 161 lb 12.8 oz (73.4 kg)  03/18/19 164 lb 4 oz (74.5 kg)   BP 119/81 Comment: per patient  Pulse 90 Comment: per patient  Temp 97.7 F (36.5 C) Comment: per patient  Wt 155 lb (70.3 kg) Comment: per patient  LMP 12/06/2014 (LMP Unknown)   BMI 27.46 kg/m    Physical Exam Constitutional:      Appearance: Normal appearance. She is not ill-appearing.  HENT:     Head: Normocephalic and atraumatic.     Right Ear: External ear normal.     Left Ear: External ear normal.  Eyes:     Conjunctiva/sclera: Conjunctivae normal.  Pulmonary:     Effort: Pulmonary effort is normal. No respiratory distress.  Neurological:     Mental Status: She is alert. Mental status is at baseline.  Psychiatric:        Mood and Affect: Mood normal.        Behavior: Behavior normal.        Thought Content: Thought content normal.        Judgment: Judgment normal.             Assessment & Plan:   Problem List Items Addressed This Visit    None    Visit Diagnoses    COVID-19 virus infection    -  Primary     Symptoms improving Continue symptomatic care If new  fevers, worsening symptoms follow-up and could do a course of abx. No need currently  Return if symptoms worsen or fail to improve.  Lesleigh Noe, MD

## 2019-04-22 ENCOUNTER — Telehealth: Payer: Self-pay

## 2019-04-22 DIAGNOSIS — Z20828 Contact with and (suspected) exposure to other viral communicable diseases: Secondary | ICD-10-CM | POA: Diagnosis not present

## 2019-04-22 NOTE — Telephone Encounter (Signed)
Appreciate the update.   1) Does she think she may have a urine infection? Or is she urinating more due to increased intake?  2) Cough - would recommend over the counter medication for cough and could offer visit if she feels this is significantly worse. Covid cough symptoms may last 2-3 months. So if otherwise improving - no new fevers then could continue home monitoring

## 2019-04-22 NOTE — Telephone Encounter (Signed)
Pt started with Covid symptoms 04-06-19. She is starting to have a dry cough that is painful and frequent urination. Was told to keep Dr Einar Pheasant updated.

## 2019-04-22 NOTE — Telephone Encounter (Signed)
Spoke with Dr Einar Pheasant and advised patient that we would need to do a virtual visit since this is a new symptom. Patient stated she would see how she does over the weekend and then if not better would go to urgent care. Patient said:" I just do not know about these virtual visits." I spoke with Dr Einar Pheasant and she was comfortable with seen patient next week in person. I advised patient of this and scheduled for an appointment on 04/27/19 per patient's request.  Also patient said she got re tested today for Covid. Dr Einar Pheasant aware and it is still ok to let patient come in.  FYI to PCP

## 2019-04-22 NOTE — Telephone Encounter (Signed)
Patient advised. Patient states she is going to the bathroom every 30 to 45 minutes and does not think it is due to increase fluid intake. I spoke with patient about doing a Urine drop off for tomorrow and do a virtual visit. Patient asked why she needed to do another virtual visit, I explained to patient that her recent virtual visit was for a different symptom and the protocol usually is to have a visit for urine issue. I told patient I will ask Dr Einar Pheasant if patient can drop off urine tomorrow (she has to come and get the sterile container) without a visit and would call her back

## 2019-04-27 ENCOUNTER — Other Ambulatory Visit: Payer: Self-pay

## 2019-04-27 ENCOUNTER — Ambulatory Visit (INDEPENDENT_AMBULATORY_CARE_PROVIDER_SITE_OTHER): Payer: BC Managed Care – PPO | Admitting: Family Medicine

## 2019-04-27 ENCOUNTER — Encounter: Payer: Self-pay | Admitting: Family Medicine

## 2019-04-27 VITALS — BP 124/72 | HR 100 | Temp 98.2°F | Ht 63.0 in | Wt 160.5 lb

## 2019-04-27 DIAGNOSIS — R350 Frequency of micturition: Secondary | ICD-10-CM | POA: Diagnosis not present

## 2019-04-27 DIAGNOSIS — Z8616 Personal history of COVID-19: Secondary | ICD-10-CM

## 2019-04-27 DIAGNOSIS — U071 COVID-19: Secondary | ICD-10-CM

## 2019-04-27 DIAGNOSIS — I10 Essential (primary) hypertension: Secondary | ICD-10-CM

## 2019-04-27 HISTORY — DX: COVID-19: U07.1

## 2019-04-27 LAB — POCT URINALYSIS DIPSTICK
Bilirubin, UA: NEGATIVE
Blood, UA: NEGATIVE
Glucose, UA: NEGATIVE
Ketones, UA: NEGATIVE
Leukocytes, UA: NEGATIVE
Nitrite, UA: NEGATIVE
Protein, UA: NEGATIVE
Spec Grav, UA: 1.01 (ref 1.010–1.025)
Urobilinogen, UA: 0.2 E.U./dL
pH, UA: 6 (ref 5.0–8.0)

## 2019-04-27 NOTE — Progress Notes (Signed)
   Subjective:     Jamilynn Keniah Donofrio is a 49 y.o. female presenting for Urinary Frequency     HPI   #Covid - still with some lingering cough - overall feels that her cough is improving  #low BP - was noticing her BP was low at home - had been avoiding salt - sbp 102 - started eating some salt and her bp is coming up  - denies lightheadedness or dizziness    Review of Systems   Social History   Tobacco Use  Smoking Status Never Smoker  Smokeless Tobacco Never Used        Objective:    BP Readings from Last 3 Encounters:  04/27/19 124/72  04/20/19 119/81  03/31/19 98/68   Wt Readings from Last 3 Encounters:  04/27/19 160 lb 8 oz (72.8 kg)  04/20/19 155 lb (70.3 kg)  03/31/19 161 lb 12.8 oz (73.4 kg)    BP 124/72   Pulse 100   Temp 98.2 F (36.8 C)   Ht 5\' 3"  (1.6 m)   Wt 160 lb 8 oz (72.8 kg)   LMP 12/06/2014 (LMP Unknown)   SpO2 99%   BMI 28.43 kg/m    Physical Exam Constitutional:      General: She is not in acute distress.    Appearance: She is well-developed. She is not diaphoretic.  HENT:     Right Ear: External ear normal.     Left Ear: External ear normal.     Nose: Nose normal.  Eyes:     Conjunctiva/sclera: Conjunctivae normal.  Cardiovascular:     Rate and Rhythm: Normal rate and regular rhythm.     Heart sounds: No murmur.  Pulmonary:     Effort: Pulmonary effort is normal. No respiratory distress.     Breath sounds: Normal breath sounds. No wheezing, rhonchi or rales.  Musculoskeletal:     Cervical back: Neck supple.  Skin:    General: Skin is warm and dry.     Capillary Refill: Capillary refill takes less than 2 seconds.  Neurological:     Mental Status: She is alert. Mental status is at baseline.  Psychiatric:        Mood and Affect: Mood normal.        Behavior: Behavior normal.     UA: normal      Assessment & Plan:   Problem List Items Addressed This Visit      Cardiovascular and Mediastinum   HYPERTENSION, BENIGN ESSENTIAL (Chronic)    BP currently normal w/o medication. Cont home monitoring. May have been lower in setting of infection.         Other   COVID-19 virus infection - Primary    Symptoms improving. Discussed possible long course of symptoms. Also discussed waiting 45 days after positive test to get the vaccine. 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 If cough lasting >3 months will do cxr or if worsening symptoms return.        Other Visit Diagnoses    Urinary frequency       Relevant Orders   POCT urinalysis dipstick (Completed)     Urine symptoms worse last week and since improved. UA normal w/o sign of infection. Cont to monitor   Return if symptoms worsen or fail to improve.  Lesleigh Noe, MD

## 2019-04-27 NOTE — Patient Instructions (Signed)
#   Covid - cough can last up to 2-3 months - if still on going, return and we can get a chest x-ray - urine was normal  - wait 45 days to get vaccine -- but likely safe to get now  #blood pressure - continue to monitor - if dizziness or lightheadedness and staying low let me know - drink lots of water

## 2019-04-27 NOTE — Assessment & Plan Note (Signed)
Symptoms improving. Discussed possible long course of symptoms. Also discussed waiting 45 days after positive test to get the vaccine. 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 If cough lasting >3 months will do cxr or if worsening symptoms return.

## 2019-04-27 NOTE — Assessment & Plan Note (Signed)
BP currently normal w/o medication. Cont home monitoring. May have been lower in setting of infection.

## 2019-05-05 DIAGNOSIS — E041 Nontoxic single thyroid nodule: Secondary | ICD-10-CM | POA: Diagnosis not present

## 2019-05-06 ENCOUNTER — Other Ambulatory Visit: Payer: Self-pay | Admitting: General Surgery

## 2019-05-06 DIAGNOSIS — E041 Nontoxic single thyroid nodule: Secondary | ICD-10-CM

## 2019-05-13 ENCOUNTER — Inpatient Hospital Stay: Admission: RE | Admit: 2019-05-13 | Payer: BC Managed Care – PPO | Source: Ambulatory Visit

## 2019-05-19 ENCOUNTER — Other Ambulatory Visit (HOSPITAL_COMMUNITY)
Admission: RE | Admit: 2019-05-19 | Discharge: 2019-05-19 | Disposition: A | Discharge status: Home / Self Care | Payer: BC Managed Care – PPO | Source: Ambulatory Visit | Attending: Physician Assistant | Admitting: Physician Assistant

## 2019-05-19 ENCOUNTER — Ambulatory Visit
Admission: RE | Admit: 2019-05-19 | Discharge: 2019-05-19 | Disposition: A | Discharge status: Home / Self Care | Payer: BC Managed Care – PPO | Source: Ambulatory Visit | Attending: General Surgery | Admitting: General Surgery

## 2019-05-19 DIAGNOSIS — E041 Nontoxic single thyroid nodule: Secondary | ICD-10-CM

## 2019-05-24 ENCOUNTER — Ambulatory Visit: Payer: Self-pay | Admitting: General Surgery

## 2019-05-24 LAB — CYTOLOGY - NON PAP

## 2019-05-26 ENCOUNTER — Telehealth: Payer: Self-pay

## 2019-05-26 NOTE — Telephone Encounter (Signed)
[name] I, RN  Patient wishes to cancel cardiac ct - she said they figured out why her bp is low and no longer needs test. Apple Cider vinegar consumption   I will cancel test for now

## 2019-05-26 NOTE — Telephone Encounter (Signed)
I called and spoke to the patient. Made her aware that we did not order the Cardiac CT for low BP. Patient was having chest pain and has a Hx of heart transplant and wanted to r/o CAD. Patient states that she has not had any recurrent episodes of chest pain and does not want to have the test anymore. Instructed the patient to call back if her Sx return. She verbalized understanding and thanked me for the call.

## 2019-05-27 DIAGNOSIS — C73 Malignant neoplasm of thyroid gland: Secondary | ICD-10-CM | POA: Diagnosis not present

## 2019-05-31 ENCOUNTER — Ambulatory Visit (HOSPITAL_COMMUNITY): Payer: BC Managed Care – PPO

## 2019-06-02 NOTE — Progress Notes (Signed)
DUE TO COVID-19 ONLY ONE VISITOR IS ALLOWED TO COME WITH YOU AND STAY IN THE WAITING ROOM ONLY DURING PRE OP AND PROCEDURE DAY OF SURGERY. THE 1 VISITOR MAY VISIT WITH YOU AFTER SURGERY IN YOUR PRIVATE ROOM DURING VISITING HOURS ONLY!  YOU NEED TO HAVE A COVID 19 TEST ON____3/18 ___ @__135pm_____ , THIS TEST MUST BE DONE BEFORE SURGERY, COME  [place], [place] , [address].  ([place]) ONCE YOUR COVID TEST IS COMPLETED, PLEASE BEGIN THE QUARANTINE INSTRUCTIONS AS OUTLINED IN YOUR HANDOUT.                [name]  06/02/2019   Your procedure is scheduled on:  06/07/2019   Report to [facility] Main  Entrance   Report to admitting at   1130 AM     Call this number if you have problems the morning of surgery [phone]    Remember: Do not eat food  :After Midnight. BRUSH YOUR TEETH MORNING OF SURGERY AND RINSE YOUR MOUTH OUT, NO CHEWING GUM CANDY OR MINTS.     Take these medicines the morning of surgery with A SIP OF WATER:  Metoprolol ( Lopressor)                                  You may not have any metal on your body including hair pins and              piercings  Do not wear jewelry, make-up, lotions, powders or perfumes, deodorant             Do not wear nail polish on your fingernails.  Do not shave  48 hours prior to surgery.     Do not bring valuables to the hospital. [place].  Contacts, dentures or bridgework may not be worn into surgery.  Leave suitcase in the car. After surgery it may be brought to your room.     Patients discharged the day of surgery will not be allowed to drive home. IF YOU ARE HAVING SURGERY AND GOING HOME THE SAME DAY, YOU MUST HAVE AN ADULT TO DRIVE YOU HOME AND BE WITH YOU FOR 24 HOURS. YOU MAY GO HOME BY TAXI OR UBER OR ORTHERWISE, BUT AN ADULT MUST ACCOMPANY YOU HOME AND STAY WITH YOU FOR 24 HOURS.  Name and phone number of your driver:                 Please read over the following fact sheets you were given: _____________________________________________________________________             NO SOLID FOOD AFTER MIDNIGHT THE NIGHT PRIOR TO SURGERY. NOTHING BY MOUTH EXCEPT CLEAR LIQUIDS UNTIL   1030am. PLEASE FINISH ENSURE DRINK PER SURGEON ORDER  WHICH NEEDS TO BE COMPLETED AT .1030am   CLEAR LIQUID DIET   Foods Allowed                                                                     Foods Excluded  Coffee and tea, regular and  decaf                             liquids that you cannot  Plain Jell-O any favor except red or purple                                           see through such as: Fruit ices (not with fruit pulp)                                     milk, soups, orange juice  Iced Popsicles                                    All solid food Carbonated beverages, regular and diet                                    Cranberry, grape and apple juices Sports drinks like Gatorade Lightly seasoned clear broth or consume(fat free) Sugar, honey syrup  Sample Menu Breakfast                                Lunch                                     Supper Cranberry juice                    Beef broth                            Chicken broth Jell-O                                     Grape juice                           Apple juice Coffee or tea                        Jell-O                                      Popsicle                                                Coffee or tea                        Coffee or tea  _____________________________________________________________________  [facility] - Preparing for Surgery Before surgery, you can play an important role.  Because skin is not sterile, your skin needs to be as free of germs as  possible.  You can reduce the number of germs on your skin by washing with CHG (chlorahexidine gluconate) soap before surgery.  CHG is an antiseptic cleaner which kills germs and bonds with the skin  to continue killing germs even after washing. Please DO NOT use if you have an allergy to CHG or antibacterial soaps.  If your skin becomes reddened/irritated stop using the CHG and inform your nurse when you arrive at Short Stay. Do not shave (including legs and underarms) for at least 48 hours prior to the first CHG shower.  You may shave your face/neck. Please follow these instructions carefully:  1.  Shower with CHG Soap the night before surgery and the  morning of Surgery.  2.  If you choose to wash your hair, wash your hair first as usual with your  normal  shampoo.  3.  After you shampoo, rinse your hair and body thoroughly to remove the  shampoo.                           4.  Use CHG as you would any other liquid soap.  You can apply chg directly  to the skin and wash                       Gently with a scrungie or clean washcloth.  5.  Apply the CHG Soap to your body ONLY FROM THE NECK DOWN.   Do not use on face/ open                           Wound or open sores. Avoid contact with eyes, ears mouth and genitals (private parts).                       Wash face,  Genitals (private parts) with your normal soap.             6.  Wash thoroughly, paying special attention to the area where your surgery  will be performed.  7.  Thoroughly rinse your body with warm water from the neck down.  8.  DO NOT shower/wash with your normal soap after using and rinsing off  the CHG Soap.                9.  Pat yourself dry with a clean towel.            10.  Wear clean pajamas.            11.  Place clean sheets on your bed the night of your first shower and do not  sleep with pets. Day of Surgery : Do not apply any lotions/deodorants the morning of surgery.  Please wear clean clothes to the hospital/surgery center.  FAILURE TO FOLLOW THESE INSTRUCTIONS MAY RESULT IN THE CANCELLATION OF YOUR SURGERY PATIENT SIGNATURE_________________________________  NURSE  SIGNATURE__________________________________  ________________________________________________________________________

## 2019-06-03 ENCOUNTER — Other Ambulatory Visit (HOSPITAL_COMMUNITY): Payer: BC Managed Care – PPO

## 2019-06-03 ENCOUNTER — Other Ambulatory Visit: Payer: Self-pay

## 2019-06-03 ENCOUNTER — Ambulatory Visit (HOSPITAL_COMMUNITY)
Admission: RE | Admit: 2019-06-03 | Discharge: 2019-06-03 | Disposition: A | Discharge status: Home / Self Care | Payer: BC Managed Care – PPO | Source: Ambulatory Visit | Attending: Anesthesiology | Admitting: Anesthesiology

## 2019-06-03 ENCOUNTER — Encounter (HOSPITAL_COMMUNITY): Payer: Self-pay

## 2019-06-03 ENCOUNTER — Encounter (HOSPITAL_COMMUNITY)
Admission: RE | Admit: 2019-06-03 | Discharge: 2019-06-03 | Disposition: A | Discharge status: Home / Self Care | Payer: BC Managed Care – PPO | Source: Ambulatory Visit | Attending: General Surgery | Admitting: General Surgery

## 2019-06-03 ENCOUNTER — Other Ambulatory Visit (HOSPITAL_COMMUNITY)
Admission: RE | Admit: 2019-06-03 | Discharge: 2019-06-03 | Disposition: A | Discharge status: Home / Self Care | Payer: BC Managed Care – PPO | Source: Ambulatory Visit | Attending: General Surgery | Admitting: General Surgery

## 2019-06-03 ENCOUNTER — Other Ambulatory Visit: Payer: BC Managed Care – PPO

## 2019-06-03 DIAGNOSIS — Z01818 Encounter for other preprocedural examination: Secondary | ICD-10-CM

## 2019-06-03 DIAGNOSIS — I10 Essential (primary) hypertension: Secondary | ICD-10-CM | POA: Diagnosis not present

## 2019-06-03 HISTORY — DX: Prediabetes: R73.03

## 2019-06-03 LAB — CBC WITH DIFFERENTIAL/PLATELET
Abs Immature Granulocytes: 0.01 10*3/uL (ref 0.00–0.07)
Basophils Absolute: 0 10*3/uL (ref 0.0–0.1)
Basophils Relative: 1 %
Eosinophils Absolute: 0.1 10*3/uL (ref 0.0–0.5)
Eosinophils Relative: 2 %
HCT: 36.4 % (ref 36.0–46.0)
Hemoglobin: 11.6 g/dL — ABNORMAL LOW (ref 12.0–15.0)
Immature Granulocytes: 0 %
Lymphocytes Relative: 51 %
Lymphs Abs: 2.5 10*3/uL (ref 0.7–4.0)
MCH: 28.2 pg (ref 26.0–34.0)
MCHC: 31.9 g/dL (ref 30.0–36.0)
MCV: 88.6 fL (ref 80.0–100.0)
Monocytes Absolute: 0.3 10*3/uL (ref 0.1–1.0)
Monocytes Relative: 6 %
Neutro Abs: 2 10*3/uL (ref 1.7–7.7)
Neutrophils Relative %: 40 %
Platelets: 242 10*3/uL (ref 150–400)
RBC: 4.11 MIL/uL (ref 3.87–5.11)
RDW: 14.7 % (ref 11.5–15.5)
WBC: 4.9 10*3/uL (ref 4.0–10.5)
nRBC: 0 % (ref 0.0–0.2)

## 2019-06-03 LAB — BASIC METABOLIC PANEL
Anion gap: 10 (ref 5–15)
BUN: 13 mg/dL (ref 6–20)
CO2: 22 mmol/L (ref 22–32)
Calcium: 9.2 mg/dL (ref 8.9–10.3)
Chloride: 109 mmol/L (ref 98–111)
Creatinine, Ser: 0.76 mg/dL (ref 0.44–1.00)
GFR calc Af Amer: >60 mL/min (ref >60)
GFR calc non Af Amer: >60 mL/min (ref >60)
Glucose, Bld: 107 mg/dL — ABNORMAL HIGH (ref 70–99)
Potassium: 4 mmol/L (ref 3.5–5.1)
Sodium: 141 mmol/L (ref 135–145)

## 2019-06-03 LAB — SARS CORONAVIRUS 2 (TAT 6-24 HRS): SARS Coronavirus 2: NEGATIVE

## 2019-06-03 LAB — HEMOGLOBIN A1C
Hgb A1c MFr Bld: 6 % — ABNORMAL HIGH (ref 4.8–5.6)
Mean Plasma Glucose: 125.5 mg/dL

## 2019-06-03 NOTE — Patient Instructions (Signed)
DUE TO COVID-19 ONLY ONE VISITOR IS ALLOWED TO COME WITH YOU AND STAY IN THE WAITING ROOM ONLY DURING PRE OP AND PROCEDURE DAY OF SURGERY. THE 1 VISITOR MAY VISIT WITH YOU AFTER SURGERY IN YOUR PRIVATE ROOM DURING VISITING HOURS ONLY!  YOU NEED TO HAVE A COVID 19 TEST ON____3/18 ___ @__135pm_____ , THIS TEST MUST BE DONE BEFORE SURGERY, COME  [name], [name] , [address].  ([place]) ONCE YOUR COVID TEST IS COMPLETED, PLEASE BEGIN THE QUARANTINE INSTRUCTIONS AS OUTLINED IN YOUR HANDOUT.                [name]  06/03/2019   Your procedure is scheduled on:  06/07/2019    Report to [facility] Main  Entrance   Report to admitting at   1130 AM     Call this number if you have problems the morning of surgery [phone]    Remember: Do not eat food  :After Midnight. BRUSH YOUR TEETH MORNING OF SURGERY AND RINSE YOUR MOUTH OUT, NO CHEWING GUM CANDY OR MINTS.     Take these medicines the morning of surgery with A SIP OF WATER:  Metoprolol ( Lopressor)                                  You may not have any metal on your body including hair pins and              piercings  Do not wear jewelry, make-up, lotions, powders or perfumes, deodorant             Do not wear nail polish on your fingernails.  Do not shave  48 hours prior to surgery.     Do not bring valuables to the hospital. [place].  Contacts, dentures or bridgework may not be worn into surgery.  Leave suitcase in the car. After surgery it may be brought to your room.     Patients discharged the day of surgery will not be allowed to drive home. IF YOU ARE HAVING SURGERY AND GOING HOME THE SAME DAY, YOU MUST HAVE AN ADULT TO DRIVE YOU HOME AND BE WITH YOU FOR 24 HOURS. YOU MAY GO HOME BY TAXI OR UBER OR ORTHERWISE, BUT AN ADULT MUST ACCOMPANY YOU HOME AND STAY WITH YOU FOR 24 HOURS.  Name and phone number of your driver:                 Please read over the following fact sheets you were given: _____________________________________________________________________             NO SOLID FOOD AFTER MIDNIGHT THE NIGHT PRIOR TO SURGERY. NOTHING BY MOUTH EXCEPT CLEAR LIQUIDS UNTIL   1030am. PLEASE FINISH ENSURE DRINK PER SURGEON ORDER  WHICH NEEDS TO BE COMPLETED AT .1030am   CLEAR LIQUID DIET   Foods Allowed                                                                     Foods Excluded  Coffee and tea, regular  and decaf                             liquids that you cannot  Plain Jell-O any favor except red or purple                                           see through such as: Fruit ices (not with fruit pulp)                                     milk, soups, orange juice  Iced Popsicles                                    All solid food Carbonated beverages, regular and diet                                    Cranberry, grape and apple juices Sports drinks like Gatorade Lightly seasoned clear broth or consume(fat free) Sugar, honey syrup  Sample Menu Breakfast                                Lunch                                     Supper Cranberry juice                    Beef broth                            Chicken broth Jell-O                                     Grape juice                           Apple juice Coffee or tea                        Jell-O                                      Popsicle                                                Coffee or tea                        Coffee or tea  _____________________________________________________________________  [facility] - Preparing for Surgery Before surgery, you can play an important role.  Because skin is not sterile, your skin needs to be as free of germs  as possible.  You can reduce the number of germs on your skin by washing with CHG (chlorahexidine gluconate) soap before surgery.  CHG is an antiseptic cleaner which kills germs and bonds with the skin  to continue killing germs even after washing. Please DO NOT use if you have an allergy to CHG or antibacterial soaps.  If your skin becomes reddened/irritated stop using the CHG and inform your nurse when you arrive at Short Stay. Do not shave (including legs and underarms) for at least 48 hours prior to the first CHG shower.  You may shave your face/neck. Please follow these instructions carefully:  1.  Shower with CHG Soap the night before surgery and the  morning of Surgery.  2.  If you choose to wash your hair, wash your hair first as usual with your  normal  shampoo.  3.  After you shampoo, rinse your hair and body thoroughly to remove the  shampoo.                           4.  Use CHG as you would any other liquid soap.  You can apply chg directly  to the skin and wash                       Gently with a scrungie or clean washcloth.  5.  Apply the CHG Soap to your body ONLY FROM THE NECK DOWN.   Do not use on face/ open                           Wound or open sores. Avoid contact with eyes, ears mouth and genitals (private parts).                       Wash face,  Genitals (private parts) with your normal soap.             6.  Wash thoroughly, paying special attention to the area where your surgery  will be performed.  7.  Thoroughly rinse your body with warm water from the neck down.  8.  DO NOT shower/wash with your normal soap after using and rinsing off  the CHG Soap.                9.  Pat yourself dry with a clean towel.            10.  Wear clean pajamas.            11.  Place clean sheets on your bed the night of your first shower and do not  sleep with pets. Day of Surgery : Do not apply any lotions/deodorants the morning of surgery.  Please wear clean clothes to the hospital/surgery center.  FAILURE TO FOLLOW THESE INSTRUCTIONS MAY RESULT IN THE CANCELLATION OF YOUR SURGERY PATIENT SIGNATURE_________________________________  NURSE  SIGNATURE__________________________________  ________________________________________________________________________

## 2019-06-03 NOTE — Progress Notes (Addendum)
PCP - Waunita Schooner Cardiologist - Dr. Concepcion Living 03-31-19 epic  Chest x-ray -  EKG - 03-18-19 Stress Test -  ECHO - 03-26-19 Cardiac Cath -   Sleep Study -  CPAP -   Fasting Blood Sugar -  Checks Blood Sugar _____ times a day  Blood Thinner Instructions: Aspirin Instructions: Last Dose:  Anesthesia review:   Patient denies shortness of breath, fever, cough and chest pain at PAT appointment   NONE   Patient verbalized understanding of instructions that were given to them at the PAT appointment. Patient was also instructed that they will need to review over the PAT instructions again at home before surgery.

## 2019-06-04 ENCOUNTER — Other Ambulatory Visit: Payer: BC Managed Care – PPO

## 2019-06-06 MED ORDER — BUPIVACAINE LIPOSOME 1.3 % IJ SUSP
20.0000 mL | INTRAMUSCULAR | Status: DC
  Filled 2019-06-06: qty 20

## 2019-06-07 ENCOUNTER — Encounter (HOSPITAL_COMMUNITY)
Admission: RE | Disposition: A | Discharge status: Home / Self Care | Payer: Self-pay | Source: Home / Self Care | Attending: General Surgery

## 2019-06-07 ENCOUNTER — Encounter (HOSPITAL_COMMUNITY): Payer: Self-pay | Admitting: General Surgery

## 2019-06-07 ENCOUNTER — Inpatient Hospital Stay (HOSPITAL_COMMUNITY)
Admission: RE | Admit: 2019-06-07 | Discharge: 2019-06-08 | DRG: 627 | Disposition: A | Discharge status: Home / Self Care | Payer: BC Managed Care – PPO | Attending: General Surgery | Admitting: General Surgery

## 2019-06-07 ENCOUNTER — Inpatient Hospital Stay (HOSPITAL_COMMUNITY): Payer: BC Managed Care – PPO | Admitting: Anesthesiology

## 2019-06-07 DIAGNOSIS — Z806 Family history of leukemia: Secondary | ICD-10-CM | POA: Diagnosis not present

## 2019-06-07 DIAGNOSIS — Z885 Allergy status to narcotic agent status: Secondary | ICD-10-CM | POA: Diagnosis not present

## 2019-06-07 DIAGNOSIS — Z79899 Other long term (current) drug therapy: Secondary | ICD-10-CM

## 2019-06-07 DIAGNOSIS — Z823 Family history of stroke: Secondary | ICD-10-CM | POA: Diagnosis not present

## 2019-06-07 DIAGNOSIS — C73 Malignant neoplasm of thyroid gland: Principal | ICD-10-CM | POA: Diagnosis present

## 2019-06-07 DIAGNOSIS — Z8249 Family history of ischemic heart disease and other diseases of the circulatory system: Secondary | ICD-10-CM

## 2019-06-07 DIAGNOSIS — Z800 Family history of malignant neoplasm of digestive organs: Secondary | ICD-10-CM | POA: Diagnosis not present

## 2019-06-07 DIAGNOSIS — I10 Essential (primary) hypertension: Secondary | ICD-10-CM | POA: Diagnosis not present

## 2019-06-07 DIAGNOSIS — Z888 Allergy status to other drugs, medicaments and biological substances status: Secondary | ICD-10-CM

## 2019-06-07 DIAGNOSIS — D259 Leiomyoma of uterus, unspecified: Secondary | ICD-10-CM | POA: Diagnosis not present

## 2019-06-07 DIAGNOSIS — Z833 Family history of diabetes mellitus: Secondary | ICD-10-CM | POA: Diagnosis not present

## 2019-06-07 DIAGNOSIS — D630 Anemia in neoplastic disease: Secondary | ICD-10-CM | POA: Diagnosis not present

## 2019-06-07 DIAGNOSIS — R7303 Prediabetes: Secondary | ICD-10-CM | POA: Diagnosis present

## 2019-06-07 HISTORY — PX: THYROIDECTOMY: SHX17

## 2019-06-07 HISTORY — DX: Malignant neoplasm of thyroid gland: C73

## 2019-06-07 LAB — CREATININE, SERUM
Creatinine, Ser: 0.76 mg/dL (ref 0.44–1.00)
GFR calc Af Amer: >60 mL/min (ref >60)
GFR calc non Af Amer: >60 mL/min (ref >60)

## 2019-06-07 LAB — CBC
HCT: 38.9 % (ref 36.0–46.0)
Hemoglobin: 12.3 g/dL (ref 12.0–15.0)
MCH: 28.3 pg (ref 26.0–34.0)
MCHC: 31.6 g/dL (ref 30.0–36.0)
MCV: 89.4 fL (ref 80.0–100.0)
Platelets: 214 10*3/uL (ref 150–400)
RBC: 4.35 MIL/uL (ref 3.87–5.11)
RDW: 14.9 % (ref 11.5–15.5)
WBC: 12.1 10*3/uL — ABNORMAL HIGH (ref 4.0–10.5)
nRBC: 0 % (ref 0.0–0.2)

## 2019-06-07 SURGERY — THYROIDECTOMY
Anesthesia: General | Site: Neck

## 2019-06-07 MED ORDER — ACETAMINOPHEN 500 MG PO TABS
1000.0000 mg | ORAL_TABLET | Freq: Once | ORAL | Status: AC
  Administered 2019-06-07: 1000 mg via ORAL
  Filled 2019-06-07: qty 2

## 2019-06-07 MED ORDER — FENTANYL CITRATE (PF) 250 MCG/5ML IJ SOLN
INTRAMUSCULAR | Status: DC | PRN
  Administered 2019-06-07 (×5): 50 ug via INTRAVENOUS

## 2019-06-07 MED ORDER — HYDROMORPHONE HCL 1 MG/ML IJ SOLN
0.2500 mg | INTRAMUSCULAR | Status: DC | PRN
  Administered 2019-06-07 (×2): 0.5 mg via INTRAVENOUS
  Administered 2019-06-07: 0.25 mg via INTRAVENOUS

## 2019-06-07 MED ORDER — DIPHENHYDRAMINE HCL 50 MG/ML IJ SOLN: 25.0000 mg | Freq: Four times a day (QID) | INTRAMUSCULAR | Status: DC | PRN

## 2019-06-07 MED ORDER — MIDAZOLAM HCL 5 MG/5ML IJ SOLN
INTRAMUSCULAR | Status: DC | PRN
  Administered 2019-06-07: 2 mg via INTRAVENOUS

## 2019-06-07 MED ORDER — PROPOFOL 10 MG/ML IV BOLUS
INTRAVENOUS | Status: AC
  Filled 2019-06-07: qty 20

## 2019-06-07 MED ORDER — SCOPOLAMINE 1 MG/3DAYS TD PT72
MEDICATED_PATCH | TRANSDERMAL | Status: DC | PRN
  Administered 2019-06-07: 1 via TRANSDERMAL

## 2019-06-07 MED ORDER — SODIUM CHLORIDE 0.9 % IV SOLN: INTRAVENOUS | Status: DC

## 2019-06-07 MED ORDER — ENOXAPARIN SODIUM 40 MG/0.4ML ~~LOC~~ SOLN
40.0000 mg | SUBCUTANEOUS | Status: DC
  Administered 2019-06-08: 40 mg via SUBCUTANEOUS
  Filled 2019-06-07: qty 0.4

## 2019-06-07 MED ORDER — LACTATED RINGERS IV SOLN: INTRAVENOUS | Status: DC

## 2019-06-07 MED ORDER — MIDAZOLAM HCL 2 MG/2ML IJ SOLN
INTRAMUSCULAR | Status: AC
  Filled 2019-06-07: qty 2

## 2019-06-07 MED ORDER — MEPERIDINE HCL 50 MG/ML IJ SOLN: 6.2500 mg | INTRAMUSCULAR | Status: DC | PRN

## 2019-06-07 MED ORDER — BUPIVACAINE HCL (PF) 0.25 % IJ SOLN
INTRAMUSCULAR | Status: DC | PRN
  Administered 2019-06-07: 20 mL

## 2019-06-07 MED ORDER — CHLORHEXIDINE GLUCONATE CLOTH 2 % EX PADS: 6.0000 | MEDICATED_PAD | Freq: Once | CUTANEOUS | Status: DC

## 2019-06-07 MED ORDER — METOPROLOL TARTRATE 5 MG/5ML IV SOLN: 5.0000 mg | Freq: Four times a day (QID) | INTRAVENOUS | Status: DC | PRN

## 2019-06-07 MED ORDER — PHENYLEPHRINE 40 MCG/ML (10ML) SYRINGE FOR IV PUSH (FOR BLOOD PRESSURE SUPPORT)
PREFILLED_SYRINGE | INTRAVENOUS | Status: DC | PRN
  Administered 2019-06-07 (×2): 40 ug via INTRAVENOUS
  Administered 2019-06-07: 80 ug via INTRAVENOUS

## 2019-06-07 MED ORDER — PHENYLEPHRINE 40 MCG/ML (10ML) SYRINGE FOR IV PUSH (FOR BLOOD PRESSURE SUPPORT)
PREFILLED_SYRINGE | INTRAVENOUS | Status: AC
  Filled 2019-06-07: qty 10

## 2019-06-07 MED ORDER — SCOPOLAMINE 1 MG/3DAYS TD PT72
MEDICATED_PATCH | TRANSDERMAL | Status: AC
  Filled 2019-06-07: qty 1

## 2019-06-07 MED ORDER — PROPOFOL 10 MG/ML IV BOLUS
INTRAVENOUS | Status: DC | PRN
  Administered 2019-06-07: 120 mg via INTRAVENOUS

## 2019-06-07 MED ORDER — FENTANYL CITRATE (PF) 250 MCG/5ML IJ SOLN
INTRAMUSCULAR | Status: AC
  Filled 2019-06-07: qty 5

## 2019-06-07 MED ORDER — ONDANSETRON HCL 4 MG/2ML IJ SOLN
INTRAMUSCULAR | Status: DC | PRN
  Administered 2019-06-07: 4 mg via INTRAVENOUS

## 2019-06-07 MED ORDER — LIDOCAINE HCL (CARDIAC) PF 100 MG/5ML IV SOSY
PREFILLED_SYRINGE | INTRAVENOUS | Status: DC | PRN
  Administered 2019-06-07: 40 mg via INTRAVENOUS

## 2019-06-07 MED ORDER — PROMETHAZINE HCL 25 MG/ML IJ SOLN: 6.2500 mg | INTRAMUSCULAR | Status: DC | PRN

## 2019-06-07 MED ORDER — ENSURE PRE-SURGERY PO LIQD
296.0000 mL | Freq: Once | ORAL | Status: DC
  Filled 2019-06-07: qty 296

## 2019-06-07 MED ORDER — ONDANSETRON HCL 4 MG/2ML IJ SOLN: 4.0000 mg | Freq: Four times a day (QID) | INTRAMUSCULAR | Status: DC | PRN

## 2019-06-07 MED ORDER — DIPHENHYDRAMINE HCL 25 MG PO CAPS: 25.0000 mg | ORAL_CAPSULE | Freq: Four times a day (QID) | ORAL | Status: DC | PRN

## 2019-06-07 MED ORDER — HYDROMORPHONE HCL 1 MG/ML IJ SOLN
INTRAMUSCULAR | Status: AC
  Administered 2019-06-07: 0.25 mg via INTRAVENOUS
  Filled 2019-06-07: qty 2

## 2019-06-07 MED ORDER — DIPHENHYDRAMINE HCL 50 MG/ML IJ SOLN
INTRAMUSCULAR | Status: AC
  Administered 2019-06-07: 12.5 mg via INTRAVENOUS
  Filled 2019-06-07: qty 1

## 2019-06-07 MED ORDER — DEXAMETHASONE SODIUM PHOSPHATE 10 MG/ML IJ SOLN
INTRAMUSCULAR | Status: DC | PRN
  Administered 2019-06-07: 10 mg via INTRAVENOUS

## 2019-06-07 MED ORDER — ONDANSETRON 4 MG PO TBDP: 4.0000 mg | ORAL_TABLET | Freq: Four times a day (QID) | ORAL | Status: DC | PRN

## 2019-06-07 MED ORDER — KETOROLAC TROMETHAMINE 30 MG/ML IJ SOLN: 30.0000 mg | Freq: Once | INTRAMUSCULAR | Status: DC | PRN

## 2019-06-07 MED ORDER — 0.9 % SODIUM CHLORIDE (POUR BTL) OPTIME
TOPICAL | Status: DC | PRN
  Administered 2019-06-07: 1000 mL

## 2019-06-07 MED ORDER — BUPIVACAINE HCL (PF) 0.25 % IJ SOLN
INTRAMUSCULAR | Status: AC
  Filled 2019-06-07: qty 30

## 2019-06-07 MED ORDER — MORPHINE SULFATE (PF) 2 MG/ML IV SOLN: 2.0000 mg | INTRAVENOUS | Status: DC | PRN

## 2019-06-07 MED ORDER — DIPHENHYDRAMINE HCL 50 MG/ML IJ SOLN: 12.5000 mg | Freq: Once | INTRAMUSCULAR | Status: AC

## 2019-06-07 MED ORDER — OXYCODONE HCL 5 MG PO TABS: 5.0000 mg | ORAL_TABLET | Freq: Once | ORAL | Status: DC | PRN

## 2019-06-07 MED ORDER — ROCURONIUM BROMIDE 100 MG/10ML IV SOLN
INTRAVENOUS | Status: DC | PRN
  Administered 2019-06-07: 10 mg via INTRAVENOUS
  Administered 2019-06-07: 50 mg via INTRAVENOUS
  Administered 2019-06-07: 10 mg via INTRAVENOUS

## 2019-06-07 MED ORDER — CEFAZOLIN SODIUM-DEXTROSE 2-4 GM/100ML-% IV SOLN
2.0000 g | INTRAVENOUS | Status: AC
  Administered 2019-06-07: 2 g via INTRAVENOUS
  Filled 2019-06-07: qty 100

## 2019-06-07 MED ORDER — OXYCODONE HCL 5 MG PO TABS: 5.0000 mg | ORAL_TABLET | ORAL | Status: DC | PRN

## 2019-06-07 MED ORDER — SUGAMMADEX SODIUM 200 MG/2ML IV SOLN
INTRAVENOUS | Status: DC | PRN
  Administered 2019-06-07: 200 mg via INTRAVENOUS

## 2019-06-07 MED ORDER — TRAMADOL HCL 50 MG PO TABS: 50.0000 mg | ORAL_TABLET | Freq: Four times a day (QID) | ORAL | Status: DC | PRN

## 2019-06-07 MED ORDER — CALCIUM CARBONATE-VITAMIN D 500-200 MG-UNIT PO TABS
2.0000 | ORAL_TABLET | Freq: Three times a day (TID) | ORAL | Status: DC
  Administered 2019-06-07 – 2019-06-08 (×3): 2 via ORAL
  Filled 2019-06-07 (×3): qty 2

## 2019-06-07 MED ORDER — ONDANSETRON HCL 4 MG/2ML IJ SOLN
INTRAMUSCULAR | Status: AC
  Filled 2019-06-07: qty 2

## 2019-06-07 MED ORDER — OXYCODONE HCL 5 MG/5ML PO SOLN: 5.0000 mg | Freq: Once | ORAL | Status: DC | PRN

## 2019-06-07 MED ORDER — ACETAMINOPHEN 500 MG PO TABS
1000.0000 mg | ORAL_TABLET | Freq: Four times a day (QID) | ORAL | Status: DC
  Administered 2019-06-07 – 2019-06-08 (×2): 1000 mg via ORAL
  Filled 2019-06-07 (×2): qty 2

## 2019-06-07 SURGICAL SUPPLY — 42 items
ADH SKN CLS APL DERMABOND .7 (GAUZE/BANDAGES/DRESSINGS) ×1
APL PRP STRL LF DISP 70% ISPRP (MISCELLANEOUS) ×1
ATTRACTOMAT 16X20 MAGNETIC DRP (DRAPES) ×3 IMPLANT
BLADE SURG 15 STRL LF DISP TIS (BLADE) ×1 IMPLANT
BLADE SURG 15 STRL SS (BLADE) ×3
CHLORAPREP W/TINT 26 (MISCELLANEOUS) ×3 IMPLANT
CLIP VESOCCLUDE MED 6/CT (CLIP) ×8 IMPLANT
CLIP VESOCCLUDE SM WIDE 6/CT (CLIP) ×8 IMPLANT
COVER SURGICAL LIGHT HANDLE (MISCELLANEOUS) ×5 IMPLANT
COVER WAND RF STERILE (DRAPES) IMPLANT
DERMABOND ADVANCED (GAUZE/BANDAGES/DRESSINGS) ×2
DERMABOND ADVANCED .7 DNX12 (GAUZE/BANDAGES/DRESSINGS) ×1 IMPLANT
DRAPE LAPAROTOMY T 98X78 PEDS (DRAPES) ×3 IMPLANT
ELECT REM PT RETURN 15FT ADLT (MISCELLANEOUS) ×3 IMPLANT
GAUZE 4X4 16PLY RFD (DISPOSABLE) ×5 IMPLANT
GLOVE BIOGEL PI IND STRL 7.0 (GLOVE) ×1 IMPLANT
GLOVE BIOGEL PI INDICATOR 7.0 (GLOVE) ×2
GLOVE SURG SS PI 7.0 STRL IVOR (GLOVE) ×3 IMPLANT
GOWN STRL REUS W/TWL LRG LVL3 (GOWN DISPOSABLE) ×3 IMPLANT
GOWN STRL REUS W/TWL XL LVL3 (GOWN DISPOSABLE) ×3 IMPLANT
HEMOSTAT SURGICEL 2X4 FIBR (HEMOSTASIS) ×3 IMPLANT
ILLUMINATOR WAVEGUIDE N/F (MISCELLANEOUS) ×2 IMPLANT
KIT BASIN OR (CUSTOM PROCEDURE TRAY) ×3 IMPLANT
KIT TURNOVER KIT A (KITS) IMPLANT
NDL HYPO 25X1 1.5 SAFETY (NEEDLE) IMPLANT
NEEDLE HYPO 25X1 1.5 SAFETY (NEEDLE) IMPLANT
PACK BASIC VI WITH GOWN DISP (CUSTOM PROCEDURE TRAY) ×3 IMPLANT
PENCIL SMOKE EVACUATOR (MISCELLANEOUS) IMPLANT
SHEARS HARMONIC 9CM CVD (BLADE) ×2 IMPLANT
STAPLER VISISTAT 35W (STAPLE) IMPLANT
SUT MNCRL AB 4-0 PS2 18 (SUTURE) ×3 IMPLANT
SUT SILK 2 0 (SUTURE)
SUT SILK 2-0 18XBRD TIE 12 (SUTURE) IMPLANT
SUT SILK 3 0 (SUTURE)
SUT SILK 3-0 18XBRD TIE 12 (SUTURE) IMPLANT
SUT VIC AB 3-0 SH 18 (SUTURE) ×6 IMPLANT
SYR BULB IRRIGATION 50ML (SYRINGE) ×3 IMPLANT
SYR CONTROL 10ML LL (SYRINGE) IMPLANT
TOWEL OR 17X26 10 PK STRL BLUE (TOWEL DISPOSABLE) ×3 IMPLANT
TOWEL OR NON WOVEN STRL DISP B (DISPOSABLE) ×3 IMPLANT
TUBING CONNECTING 10 (TUBING) ×2 IMPLANT
TUBING CONNECTING 10' (TUBING) ×1

## 2019-06-07 NOTE — H&P (Signed)
[name] is an 49 y.o. female.   Chief Complaint: thyroid cancer HPI: 49 yo female who has always felt her thyroid is not quite right. She noted a fullness and had [place] concerning for mass. Her biopsy was consistent with papillary cancer. She is presented for resection.  Past Medical History:  Diagnosis Date  . Allergy   . Anemia   . Cancer ([place])    thyroid  . Hypertension    no meds hx of  . Pre-diabetes   . Uterine fibroid     Past Surgical History:  Procedure Laterality Date  . CESAREAN SECTION     x 3  . TUBAL LIGATION      Family History  Problem Relation Age of Onset  . Diabetes Mother   . Hypertension Mother   . Stroke Mother 60  . Heart failure Father        s/p heart transplant  . Leukemia Sister   . Hypertension Brother   . Pancreatic cancer Paternal Grandmother   . Colon cancer Neg Hx   . Rectal cancer Neg Hx   . Stomach cancer Neg Hx    Social History:  reports that she has never smoked. She has never used smokeless tobacco. She reports that she does not drink alcohol or use drugs.  Allergies:  Allergies  Allergen Reactions  . Vicodin [Hydrocodone-Acetaminophen] Shortness Of Breath    Patient states she is able to take regular tylenol    Medications Prior to Admission  Medication Sig Dispense Refill  . Multiple Vitamin (MULTIVITAMIN) tablet Take 1 tablet by mouth daily.    . metoprolol tartrate (LOPRESSOR) 50 MG tablet Take 1 tablet by mouth 2 hours prior to Cardiac CT (Patient not taking: Reported on 04/20/2019) 1 tablet 0    No results found for this or any previous visit (from the past 48 hour(s)). No results found.  Review of Systems  Constitutional: Negative for chills and fever.  HENT: Negative for hearing loss.   Respiratory: Negative for cough.   Cardiovascular: Negative for chest pain and palpitations.  Gastrointestinal: Negative for abdominal pain, nausea and vomiting.  Genitourinary: Negative for dysuria and urgency.   Musculoskeletal: Negative for myalgias and neck pain.  Skin: Negative for rash.  Neurological: Negative for dizziness and headaches.  Hematological: Does not bruise/bleed easily.  Psychiatric/Behavioral: Negative for suicidal ideas.    Blood pressure 125/89, pulse 87, temperature 98.8 F (37.1 C), temperature source Oral, resp. rate 16, height 5\' 3"  (1.6 m), weight 70.5 kg, last menstrual period 12/06/2014, SpO2 100 %. Physical Exam  Vitals reviewed. Constitutional: She is oriented to person, place, and time. She appears well-developed and well-nourished.  HENT:  Head: Normocephalic and atraumatic.  Eyes: Pupils are equal, round, and reactive to light. Conjunctivae and EOM are normal.  Neck:  Fullness right side  Cardiovascular: Normal rate and regular rhythm.  Respiratory: Effort normal and breath sounds normal.  GI: Soft. Bowel sounds are normal. She exhibits no distension. There is no abdominal tenderness.  Musculoskeletal:        General: Normal range of motion.     Cervical back: Normal range of motion and neck supple.  Neurological: She is alert and oriented to person, place, and time.  Skin: Skin is warm and dry.  Psychiatric: She has a normal mood and affect. Her behavior is normal.     Assessment/Plan 49 yo female with biopsy proven papillary thyroid cancer -thyroidectomy -inpatient admission  Mickeal Skinner, MD 06/07/2019,  11:45 AM

## 2019-06-07 NOTE — Anesthesia Preprocedure Evaluation (Addendum)
Anesthesia Evaluation  Patient identified by MRN, date of birth, ID band Patient awake    Reviewed: Allergy & Precautions, NPO status , Patient's Chart, lab work & pertinent test results  Airway Mallampati: I  TM Distance: >3 FB Neck ROM: Full    Dental  (+) Teeth Intact, Dental Advisory Given   Pulmonary neg pulmonary ROS,    Pulmonary exam normal breath sounds clear to auscultation       Cardiovascular hypertension, Normal cardiovascular exam Rhythm:Regular Rate:Normal       Neuro/Psych negative neurological ROS  negative psych ROS   GI/Hepatic negative GI ROS, Neg liver ROS,   Endo/Other  Thyroid papillary carcinoma   Pre-diabetic  Renal/GU negative Renal ROS  Female GU complaint Hx uterine fibroids    Musculoskeletal negative musculoskeletal ROS (+)   Abdominal Normal abdominal exam  (+)   Peds negative pediatric ROS (+)  Hematology  (+) Blood dyscrasia, anemia , Hb 11.6   Anesthesia Other Findings   Reproductive/Obstetrics negative OB ROS                          Anesthesia Physical Anesthesia Plan  ASA: II  Anesthesia Plan: General   Post-op Pain Management:    Induction: Intravenous  PONV Risk Score and Plan: 4 or greater and Ondansetron, Dexamethasone, Midazolam, Scopolamine patch - Pre-op and Treatment may vary due to age or medical condition  Airway Management Planned: Oral ETT  Additional Equipment: None  Intra-op Plan:   Post-operative Plan: Extubation in OR  Informed Consent: I have reviewed the patients History and Physical, chart, labs and discussed the procedure including the risks, benefits and alternatives for the proposed anesthesia with the patient or authorized representative who has indicated his/her understanding and acceptance.     Dental advisory given  Plan Discussed with: CRNA  Anesthesia Plan Comments:         Anesthesia Quick  Evaluation

## 2019-06-07 NOTE — Anesthesia Postprocedure Evaluation (Signed)
Anesthesia Post Note  Patient: [name]  Procedure(s) Performed: TOTAL THYROIDECTOMY (N/A Neck)     Patient location during evaluation: PACU Anesthesia Type: General Level of consciousness: sedated Pain management: pain level controlled Vital Signs Assessment: post-procedure vital signs reviewed and stable Respiratory status: spontaneous breathing Cardiovascular status: stable Postop Assessment: no apparent nausea or vomiting Anesthetic complications: no    Last Vitals:  Vitals:   06/07/19 1615 06/07/19 1630  BP: 122/83 123/81  Pulse: 80 84  Resp: 14 20  Temp:    SpO2: 99% 100%    Last Pain:  Vitals:   06/07/19 1615  TempSrc:   PainSc: 7    Pain Goal:                   [name]

## 2019-06-07 NOTE — Anesthesia Procedure Notes (Signed)
Procedure Name: Intubation Date/Time: 06/07/2019 1:27 PM Performed by: Glory Buff, CRNA Pre-anesthesia Checklist: Patient identified, Emergency Drugs available, Suction available and Patient being monitored Patient Re-evaluated:Patient Re-evaluated prior to induction Oxygen Delivery Method: Circle system utilized Preoxygenation: Pre-oxygenation with 100% oxygen Induction Type: IV induction Ventilation: Mask ventilation without difficulty Laryngoscope Size: Miller and 3 Grade View: Grade I Tube type: Oral Tube size: 7.0 mm Number of attempts: 1 Airway Equipment and Method: Stylet and Oral airway Placement Confirmation: ETT inserted through vocal cords under direct vision,  positive ETCO2 and breath sounds checked- equal and bilateral Secured at: 20 cm Tube secured with: Tape Dental Injury: Teeth and Oropharynx as per pre-operative assessment

## 2019-06-07 NOTE — Op Note (Signed)
Preoperative diagnosis: right thyroid papillary cancer  Postoperative diagnosis: same   Procedure: total thyroidectomy  Surgeon: Gurney Maxin, M.D.  Asst: Armandina Gemma, M.D.  Anesthesia: general  Indications for procedure: [name] is a 49 y.o. year old female with work up of thyroid nodule with cytology consistent with papillary cancer. After discussing the details of the surgery, risks and benefits she decided to proceed with surgery and was brought through preop.  Description of procedure: The patient was brought to the operating room and placed in a supine position on the operating room table. Following administration of general anesthesia, the patient was positioned and then prepped and draped in the usual aseptic fashion.   A small Kocher incision was made. Dissection was carried through subcutaneous tissues and platysma. Hemostasis was achieved with the electrocautery. Skin flaps were elevated cephalad and caudad from the thyroid notch to the sternal notch. A Mahorner self-retaining retractor was placed for exposure. Strap muscles were incised in the midline and dissection was begun on the left side.  Strap muscles were reflected laterally. Left thyroid lobe was normal in appearance.  The left lobe was gently mobilized with blunt dissection. Superior pole vessels were dissected out and divided individually between small and medium ligaclips with the harmonic scalpel. The thyroid lobe was rolled anteriorly. Branches of the inferior thyroid artery were divided between small ligaclips with the harmonic scalpel. Inferior venous tributaries were divided between ligaclips. Both the superior and inferior parathyroid glands were identified and preserved on their vascular pedicles. The recurrent laryngeal nerve was identified and preserved along its course. The ligament of Gwenlyn Found was released with the electrocautery and the gland was mobilized onto the anterior trachea. Isthmus was mobilized  across the midline. There was one medium sized pyramidal lobe present it was dissected free with cautery. Dry pack was placed in the left neck.  The right thyroid lobe was gently mobilized with blunt dissection. Right thyroid lobe had a firm mass in the superior pole with desmoplastic changes and was adherent medially. The posterior strap muscles adhered to the mass and were partially divided to stay away from the mass. The mass was adherent to the hypopharyngeal muscles as well and these were dissected free. Superior pole vessels were dissected out and divided between small and medium ligaclips with the Harmonic scalpel. Superior parathyroid was identified and preserved. Inferior venous tributaries were divided between medium ligaclips with the harmonic scalpel. The right  thyroid lobe was rolled anteriorly and the branches of the inferior thyroid artery divided between small ligaclips. The recurrent laryngeal nerve was identified and preserved along its course. The ligament of Gwenlyn Found was released with the electrocautery. The thyroid lobe was mobilized onto the anterior trachea and the remainder of the thyroid was dissected off the anterior trachea and the thyroid was completely excised. A suture was used to mark the right lobe. The entire thyroid gland was submitted to pathology for review.  The neck was irrigated with warm saline. Fibrillar was placed throughout the operative field. Strap muscles were approximated in the midline with interrupted 3-0 Vicryl sutures. Platysma was closed with interrupted 3-0 Vicryl sutures. Skin was closed with a running 4-0 Monocryl subcuticular suture. Wound was washed and Dermabond was applied. The patient was awakened from anesthesia and brought to the recovery room. The patient tolerated the procedure well.  Findings: desmoplastic changes around the right upper lobe mass with adherence to the strap muscles and hypopharyngeus muscles  Specimen: total thyroid suture marks  right side  Implant: none   Blood loss: 30 ml  Local anesthesia: 20 ml marcaine   Complications: none  Gurney Maxin, M.D. General, Bariatric, & Minimally Invasive Surgery [facility] [place]

## 2019-06-07 NOTE — Transfer of Care (Signed)
Immediate Anesthesia Transfer of Care Note  Patient: [name]  Procedure(s) Performed: TOTAL THYROIDECTOMY (N/A Neck)  Patient Location: PACU  Anesthesia Type:General  Level of Consciousness: awake, alert , oriented and patient cooperative  Airway & Oxygen Therapy: Patient Spontanous Breathing and Patient connected to face mask oxygen  Post-op Assessment: Report given to RN, Post -op Vital signs reviewed and stable and Patient moving all extremities  Post vital signs: Reviewed and stable  Last Vitals:  Vitals Value Taken Time  BP 123/74 06/07/19 1600  Temp    Pulse 88 06/07/19 1603  Resp 12 06/07/19 1603  SpO2 100 % 06/07/19 1603  Vitals shown include unvalidated device data.  Last Pain:  Vitals:   06/07/19 1127  TempSrc:   PainSc: 0-No pain         Complications: No apparent anesthesia complications

## 2019-06-08 ENCOUNTER — Other Ambulatory Visit: Payer: Self-pay

## 2019-06-08 LAB — BASIC METABOLIC PANEL
Anion gap: 7 (ref 5–15)
BUN: 11 mg/dL (ref 6–20)
CO2: 26 mmol/L (ref 22–32)
Calcium: 9.2 mg/dL (ref 8.9–10.3)
Chloride: 105 mmol/L (ref 98–111)
Creatinine, Ser: 0.76 mg/dL (ref 0.44–1.00)
GFR calc Af Amer: >60 mL/min (ref >60)
GFR calc non Af Amer: >60 mL/min (ref >60)
Glucose, Bld: 117 mg/dL — ABNORMAL HIGH (ref 70–99)
Potassium: 4.2 mmol/L (ref 3.5–5.1)
Sodium: 138 mmol/L (ref 135–145)

## 2019-06-08 LAB — CBC
HCT: 34.2 % — ABNORMAL LOW (ref 36.0–46.0)
Hemoglobin: 10.9 g/dL — ABNORMAL LOW (ref 12.0–15.0)
MCH: 28.3 pg (ref 26.0–34.0)
MCHC: 31.9 g/dL (ref 30.0–36.0)
MCV: 88.8 fL (ref 80.0–100.0)
Platelets: 212 10*3/uL (ref 150–400)
RBC: 3.85 MIL/uL — ABNORMAL LOW (ref 3.87–5.11)
RDW: 14.9 % (ref 11.5–15.5)
WBC: 9.9 10*3/uL (ref 4.0–10.5)
nRBC: 0 % (ref 0.0–0.2)

## 2019-06-08 MED ORDER — IBUPROFEN 600 MG PO TABS: 600.0000 mg | ORAL_TABLET | Freq: Three times a day (TID) | ORAL | 0 refills | Status: DC | PRN

## 2019-06-08 MED ORDER — LEVOTHYROXINE SODIUM 112 MCG PO TABS: 112.0000 ug | ORAL_TABLET | Freq: Every day | ORAL | 1 refills | Status: DC

## 2019-06-08 MED ORDER — CALCIUM CARBONATE ANTACID 500 MG PO CHEW: 2.0000 | CHEWABLE_TABLET | Freq: Three times a day (TID) | ORAL | 1 refills | Status: DC

## 2019-06-08 NOTE — Discharge Summary (Signed)
Physician Discharge Summary  Patient ID: [name] MRN: [id number] DOB/AGE: [birth year] 49 y.o.  Admit date: 06/07/2019 Discharge date: 06/08/2019  Admission Diagnoses:  Discharge Diagnoses:  Active Problems:   Thyroid cancer [facility])   Discharged Condition: good  Hospital Course: 49 yo female with thyroid cancer underwent thyroidectomy. Post op she did well. She ate well. She noted a scratchy voice, no sore throat or difficulty swallowing. Her Ca was normal. She was discharged home POD 1.  Consults: None  Significant Diagnostic Studies: labs: Ca 9.2  Treatments: thyroidectomy  Discharge Exam: Blood pressure 107/76, pulse 61, temperature 98.3 F (36.8 C), temperature source Oral, resp. rate 13, height 5\' 3"  (1.6 m), weight 70.5 kg, last menstrual period 12/06/2014, SpO2 100 %. General appearance: alert and cooperative Head: Normocephalic, without obvious abnormality, atraumatic Neck: no adenopathy, no carotid bruit, no JVD, supple, symmetrical, trachea midline and incision c/d/i, no swelling or bruising  Disposition: Discharge disposition: 01-Home or Self Care       Discharge Instructions    Call MD for:  difficulty breathing, headache or visual disturbances   Complete by: As directed    Call MD for:  extreme fatigue   Complete by: As directed    Call MD for:  persistant dizziness or light-headedness   Complete by: As directed    Call MD for:  persistant nausea and vomiting   Complete by: As directed    Call MD for:  temperature >100.4   Complete by: As directed    Diet - low sodium heart healthy   Complete by: As directed    Increase activity slowly   Complete by: As directed      Allergies as of 06/08/2019      Reactions   Vicodin [hydrocodone-acetaminophen] Shortness Of Breath   Patient states she is able to take regular tylenol      Medication List    TAKE these medications   calcium carbonate 500 MG chewable tablet Commonly known as: Tums Chew 2  tablets (400 mg of elemental calcium total) by mouth 3 (three) times daily.   ibuprofen 600 MG tablet Commonly known as: ADVIL Take 1 tablet (600 mg total) by mouth every 8 (eight) hours as needed for up to 30 doses for moderate pain.   levothyroxine 112 MCG tablet Commonly known as: Synthroid Take 1 tablet (112 mcg total) by mouth daily before breakfast.   metoprolol tartrate 50 MG tablet Commonly known as: LOPRESSOR Take 1 tablet by mouth 2 hours prior to Cardiac CT   multivitamin tablet Take 1 tablet by mouth daily.        Signed: Arta Bruce [name] 06/08/2019, 9:56 AM

## 2019-06-08 NOTE — Progress Notes (Signed)
Pt alert and oriented, tolerating diet.  D/C insrtuctions given. Pt to be d/cd home with spouse.

## 2019-06-10 LAB — SURGICAL PATHOLOGY

## 2019-06-28 ENCOUNTER — Other Ambulatory Visit: Payer: Self-pay

## 2019-06-28 ENCOUNTER — Encounter: Payer: Self-pay | Admitting: Family Medicine

## 2019-06-28 ENCOUNTER — Ambulatory Visit (INDEPENDENT_AMBULATORY_CARE_PROVIDER_SITE_OTHER): Payer: BC Managed Care – PPO | Admitting: Family Medicine

## 2019-06-28 VITALS — BP 118/84 | HR 83 | Temp 97.9°F | Resp 18 | Ht 63.0 in | Wt 159.2 lb

## 2019-06-28 DIAGNOSIS — K648 Other hemorrhoids: Secondary | ICD-10-CM | POA: Insufficient documentation

## 2019-06-28 DIAGNOSIS — R09A2 Foreign body sensation, throat: Secondary | ICD-10-CM | POA: Insufficient documentation

## 2019-06-28 DIAGNOSIS — C73 Malignant neoplasm of thyroid gland: Secondary | ICD-10-CM

## 2019-06-28 DIAGNOSIS — R0989 Other specified symptoms and signs involving the circulatory and respiratory systems: Secondary | ICD-10-CM | POA: Diagnosis not present

## 2019-06-28 NOTE — Assessment & Plan Note (Signed)
Not currently causing symptoms. Information for prevention and treatment if symptoms provided. Avoid constipation and straining. Return if becoming symptomatic.

## 2019-06-28 NOTE — Assessment & Plan Note (Addendum)
S/p thyroidectomy. In March, unable to find follow-up plan in the chart. Will discuss at next visit. Per dc summary normal Ca after surgery

## 2019-06-28 NOTE — Patient Instructions (Signed)
Throat clearing - Try Claritin for 1-2 weeks - if no improvement, try Omeprazole for 2 weeks - if no improvement, return to see me    You have a Hemorrhoid -- if this becomes symptomatic some things to try  Here are ways to prevent future issues and to treat your hemorrhoid 1) Make sure you get 25-35 g of INSOLUBLE fiber a day -- ideally from diet but you can also take a fiber supplement like Metamucil 2) Drink 1.5 to 2 liters of water daily  3) Avoid straining and prolonged periods on the toilet 4) Limit fatty foods and alcohol 5) Try to lose weight  How to care for your hemorrhoid now 1) Sitz Baths and warm water sprays 2) Wipe with a wet wipe or flushable wipe  3) Consider using stool softeners (Like Colace or Miralax) 4) Topical Hemorrhoid cream (Preparation H is available over the counter)  5) Steroid suppository - though can be expensive  Let me know if no improvement in 6 weeks -- you may need surgery

## 2019-06-28 NOTE — Progress Notes (Signed)
Subjective:     [name] is a 49 y.o. female presenting for Has something in her rectal area (like a piece of skin-since around end of December 2020/ No pain, no itching or burning)     HPI  #Rectal mass - present since December 2020 - no pain, itching, burning - small piece of skin feeling - no change in size that she has noticed - no blood in the stool, no dark stool - no issues with constipation - eats greens, cherry seeds to prevent constipation - does not strain with BM - noticed these right after the procedure was done - colonoscopy - noted small internal hemorrhoid  #thyroid cancer - doing well post-op  #Throat clearing - sensation of something stuck in her throat - breathing well, no cough - takes tums for heartburn w/o improvement - some sinus symptoms - has not tried allergy medication - notes this started prior to her thyroid surgery  Review of Systems   Social History   Tobacco Use  Smoking Status Never Smoker  Smokeless Tobacco Never Used        Objective:    BP Readings from Last 3 Encounters:  06/28/19 118/84  06/08/19 107/76  06/03/19 (!) 134/99   Wt Readings from Last 3 Encounters:  06/28/19 159 lb 4 oz (72.2 kg)  06/07/19 155 lb 6.8 oz (70.5 kg)  06/03/19 155 lb 8 oz (70.5 kg)    BP 118/84   Pulse 83   Temp 97.9 F (36.6 C)   Resp 18   Ht 5\' 3"  (1.6 m)   Wt 159 lb 4 oz (72.2 kg)   LMP 12/06/2014 (LMP Unknown)   BMI 28.21 kg/m    Physical Exam Exam conducted with a chaperone present.  Constitutional:      General: She is not in acute distress.    Appearance: She is well-developed. She is not diaphoretic.  HENT:     Right Ear: External ear normal.     Left Ear: External ear normal.  Eyes:     Conjunctiva/sclera: Conjunctivae normal.  Cardiovascular:     Rate and Rhythm: Normal rate and regular rhythm.  Pulmonary:     Effort: Pulmonary effort is normal. No respiratory distress.     Breath sounds: Normal breath  sounds. No wheezing.  Genitourinary:    Rectum: Internal hemorrhoid (which occasionally prolapsed externally) present.  Musculoskeletal:     Cervical back: Neck supple.  Skin:    General: Skin is warm and dry.     Capillary Refill: Capillary refill takes less than 2 seconds.  Neurological:     Mental Status: She is alert. Mental status is at baseline.  Psychiatric:        Mood and Affect: Mood normal.        Behavior: Behavior normal.           Assessment & Plan:   Problem List Items Addressed This Visit      Cardiovascular and Mediastinum   Prolapsed internal hemorrhoids - Primary    Not currently causing symptoms. Information for prevention and treatment if symptoms provided. Avoid constipation and straining. Return if becoming symptomatic.         Endocrine   Thyroid cancer [facility])    S/p thyroidectomy. In March, unable to find follow-up plan in the chart. Will discuss at next visit. Per dc summary normal Ca after surgery      Relevant Medications   levothyroxine (SYNTHROID) 88 MCG tablet  Other   Globus sensation    Advised trial of claritin followed by PPI if no improvement. If still not resolved will likely consider ENT referral.           Return in about 4 weeks (around 07/26/2019), or if symptoms worsen or fail to improve.  Lesleigh Noe, MD

## 2019-06-28 NOTE — Assessment & Plan Note (Signed)
Advised trial of claritin followed by PPI if no improvement. If still not resolved will likely consider ENT referral.

## 2019-06-30 ENCOUNTER — Other Ambulatory Visit: Payer: Self-pay

## 2019-06-30 ENCOUNTER — Ambulatory Visit
Admission: RE | Admit: 2019-06-30 | Discharge: 2019-06-30 | Disposition: A | Discharge status: Home / Self Care | Payer: BC Managed Care – PPO | Source: Ambulatory Visit | Attending: Family Medicine | Admitting: Family Medicine

## 2019-06-30 DIAGNOSIS — N6011 Diffuse cystic mastopathy of right breast: Secondary | ICD-10-CM | POA: Diagnosis not present

## 2019-06-30 DIAGNOSIS — N6310 Unspecified lump in the right breast, unspecified quadrant: Secondary | ICD-10-CM

## 2019-07-06 DIAGNOSIS — C73 Malignant neoplasm of thyroid gland: Secondary | ICD-10-CM | POA: Diagnosis not present

## 2019-07-26 ENCOUNTER — Ambulatory Visit: Payer: BC Managed Care – PPO | Admitting: Family Medicine

## 2019-08-04 ENCOUNTER — Other Ambulatory Visit: Payer: Self-pay | Admitting: Internal Medicine

## 2019-08-04 DIAGNOSIS — R490 Dysphonia: Secondary | ICD-10-CM | POA: Diagnosis not present

## 2019-08-04 DIAGNOSIS — C73 Malignant neoplasm of thyroid gland: Secondary | ICD-10-CM

## 2019-08-04 DIAGNOSIS — D649 Anemia, unspecified: Secondary | ICD-10-CM | POA: Diagnosis not present

## 2019-08-04 DIAGNOSIS — E890 Postprocedural hypothyroidism: Secondary | ICD-10-CM | POA: Diagnosis not present

## 2019-08-19 ENCOUNTER — Other Ambulatory Visit: Payer: BC Managed Care – PPO

## 2019-08-23 ENCOUNTER — Ambulatory Visit
Admission: RE | Admit: 2019-08-23 | Discharge: 2019-08-23 | Disposition: A | Discharge status: Home / Self Care | Payer: BC Managed Care – PPO | Source: Ambulatory Visit | Attending: Internal Medicine | Admitting: Internal Medicine

## 2019-08-23 DIAGNOSIS — C73 Malignant neoplasm of thyroid gland: Secondary | ICD-10-CM

## 2019-08-23 DIAGNOSIS — E041 Nontoxic single thyroid nodule: Secondary | ICD-10-CM | POA: Diagnosis not present

## 2019-09-01 DIAGNOSIS — E890 Postprocedural hypothyroidism: Secondary | ICD-10-CM | POA: Diagnosis not present

## 2019-09-01 DIAGNOSIS — D649 Anemia, unspecified: Secondary | ICD-10-CM | POA: Diagnosis not present

## 2019-09-01 DIAGNOSIS — C73 Malignant neoplasm of thyroid gland: Secondary | ICD-10-CM | POA: Diagnosis not present

## 2019-10-25 ENCOUNTER — Telehealth: Payer: Self-pay

## 2019-10-25 NOTE — Telephone Encounter (Signed)
Pt c/o L lower side/back discomfort starting this morning and happening 3 times for approximately 2 minutes each time. Pt reports this as more of a concern than a pain. She is requesting an apt. Pt also reports having L shoulder pain for 1 or more months when she is raising her arm or turning or doing nothing at all. She reports this as intermittent and not in conjunction with the side/back pain. Pt denies SOB, chest pain, or any other symptoms. Pt requested an apt for next Monday with Dr. Einar Pheasant. Scheduled apt and advised if any symptoms worsen or she develops any new apt to contact this office. Advised of ER precautions. Pt verbalized understanding.

## 2019-10-26 NOTE — Telephone Encounter (Signed)
Noted agree with earlier appointment if needed

## 2019-11-01 ENCOUNTER — Ambulatory Visit (INDEPENDENT_AMBULATORY_CARE_PROVIDER_SITE_OTHER): Payer: BC Managed Care – PPO | Admitting: Family Medicine

## 2019-11-01 ENCOUNTER — Other Ambulatory Visit: Payer: Self-pay

## 2019-11-01 ENCOUNTER — Encounter: Payer: Self-pay | Admitting: Family Medicine

## 2019-11-01 VITALS — BP 110/78 | HR 81 | Temp 97.5°F | Ht 63.0 in | Wt 165.8 lb

## 2019-11-01 DIAGNOSIS — R222 Localized swelling, mass and lump, trunk: Secondary | ICD-10-CM | POA: Diagnosis not present

## 2019-11-01 DIAGNOSIS — R109 Unspecified abdominal pain: Secondary | ICD-10-CM | POA: Insufficient documentation

## 2019-11-01 DIAGNOSIS — N949 Unspecified condition associated with female genital organs and menstrual cycle: Secondary | ICD-10-CM | POA: Diagnosis not present

## 2019-11-01 NOTE — Assessment & Plan Note (Signed)
Reassuring exam. Resolved prior to visit. Suspect muscle strain.

## 2019-11-01 NOTE — Progress Notes (Signed)
Subjective:     [name] is a 49 y.o. female presenting for Back Pain (lumbar into left side "comes and goes" )     Back Pain This is a new problem. The current episode started 1 to 4 weeks ago. Episode frequency: occurred once. The problem has been rapidly improving since onset. Pain location: left side. Quality: sharp pain. The pain does not radiate. The pain is the same all the time. The symptoms are aggravated by position. Pertinent negatives include no bladder incontinence, bowel incontinence, fever, headaches, numbness, tingling or weakness. Risk factors include lack of exercise and poor posture. She has tried nothing (sitting upright) for the symptoms.   Symptoms resolved on their own  Also has a knot on the side - unrelated to her symptoms, this has been present for a longtime and is not painful  #Vaginal  - has always been present - can feel a pea sized lump in the vaginal wall   Review of Systems  Constitutional: Negative for fever.  Gastrointestinal: Negative for bowel incontinence.  Genitourinary: Negative for bladder incontinence.  Musculoskeletal: Positive for back pain.  Neurological: Negative for tingling, weakness, numbness and headaches.     Social History   Tobacco Use  Smoking Status Never Smoker  Smokeless Tobacco Never Used        Objective:    BP Readings from Last 3 Encounters:  11/01/19 110/78  06/28/19 118/84  06/08/19 107/76   Wt Readings from Last 3 Encounters:  11/01/19 165 lb 12 oz (75.2 kg)  06/28/19 159 lb 4 oz (72.2 kg)  06/07/19 155 lb 6.8 oz (70.5 kg)    BP 110/78   Pulse 81   Temp (!) 97.5 F (36.4 C) (Temporal)   Ht 5\' 3"  (1.6 m)   Wt 165 lb 12 oz (75.2 kg)   LMP 12/06/2014 (LMP Unknown)   SpO2 98%   BMI 29.36 kg/m    Physical Exam Exam conducted with a chaperone present.  Constitutional:      General: She is not in acute distress.    Appearance: She is well-developed. She is not diaphoretic.  HENT:      Right Ear: External ear normal.     Left Ear: External ear normal.     Nose: Nose normal.  Eyes:     Conjunctiva/sclera: Conjunctivae normal.  Cardiovascular:     Rate and Rhythm: Normal rate.  Pulmonary:     Effort: Pulmonary effort is normal.  Genitourinary:    Pubic Area: No rash.      Labia:        Right: No lesion.        Left: No lesion.      Vagina: Normal. No tenderness.     Cervix: Normal.     Comments: Speculum exam deferred. Unable to palpate abnormal masses on exam.  Musculoskeletal:     Cervical back: Neck supple.     Comments: Back: Normal ROM w/o pain No TTP along the spine or Paraspinous or area of concern  Skin:    General: Skin is warm and dry.     Capillary Refill: Capillary refill takes less than 2 seconds.     Comments: Small, firm, slightly mobile subdural mass near the left rib cage just below the nipple line.   Neurological:     Mental Status: She is alert. Mental status is at baseline.  Psychiatric:        Mood and Affect: Mood normal.  Behavior: Behavior normal.           Assessment & Plan:   Problem List Items Addressed This Visit      Other   Vaginal lump - Primary    Pt endorses vaginal lump, however, unable to palpate any abnormalities on exam - normal tissue. Wonder if she is feeling her cervix? Discussed referral to GYN for second exam/evaluation and she would like this      Relevant Orders   Ambulatory referral to Obstetrics / Gynecology   Mass of left chest wall    Etiology unclear. Firm, slightly mobile mass. Seems too low to be an axial lymph node but as pt almost due for mammogram advised getting routine screening and will consider further imaging after. Suspect possible sebaceous or fibroma.        Side pain    Reassuring exam. Resolved prior to visit. Suspect muscle strain.           Return if symptoms worsen or fail to improve.  Lesleigh Noe, MD  This visit occurred during the SARS-CoV-2 public health  emergency.  Safety protocols were in place, including screening questions prior to the visit, additional usage of staff PPE, and extensive cleaning of exam room while observing appropriate contact time as indicated for disinfecting solutions.

## 2019-11-01 NOTE — Patient Instructions (Signed)
Back pain - continue to monitor - glad this got better  Side lump - get your routine mammogram, if normal we will get an ultrasound of this lump  Vaginal concern - referral to GYN

## 2019-11-01 NOTE — Assessment & Plan Note (Signed)
Etiology unclear. Firm, slightly mobile mass. Seems too low to be an axial lymph node but as pt almost due for mammogram advised getting routine screening and will consider further imaging after. Suspect possible sebaceous or fibroma.

## 2019-11-01 NOTE — Assessment & Plan Note (Signed)
Pt endorses vaginal lump, however, unable to palpate any abnormalities on exam - normal tissue. Wonder if she is feeling her cervix? Discussed referral to GYN for second exam/evaluation and she would like this

## 2019-11-02 ENCOUNTER — Other Ambulatory Visit: Payer: Self-pay | Admitting: Family Medicine

## 2019-11-02 DIAGNOSIS — Z1231 Encounter for screening mammogram for malignant neoplasm of breast: Secondary | ICD-10-CM

## 2019-11-17 ENCOUNTER — Telehealth: Payer: Self-pay | Admitting: Radiology

## 2019-11-17 NOTE — Telephone Encounter (Signed)
Left message on voicemail to call [id number] to schedule appointment for referral for GYN

## 2019-11-29 ENCOUNTER — Ambulatory Visit: Payer: BC Managed Care – PPO

## 2019-11-29 ENCOUNTER — Telehealth: Payer: Self-pay | Admitting: Family Medicine

## 2019-11-29 DIAGNOSIS — Z1231 Encounter for screening mammogram for malignant neoplasm of breast: Secondary | ICD-10-CM

## 2019-11-29 NOTE — Telephone Encounter (Signed)
Pt called in because she was supposed to have a mammogram today. She showed up for her appointment and it turned out they had cancelled it due to not receiving the orders. She needs a diagnostic mammogram instead of a screening.

## 2019-11-29 NOTE — Telephone Encounter (Signed)
Diagnostic ordered.

## 2019-12-01 ENCOUNTER — Other Ambulatory Visit: Payer: Self-pay | Admitting: Family Medicine

## 2019-12-01 DIAGNOSIS — N644 Mastodynia: Secondary | ICD-10-CM

## 2019-12-02 ENCOUNTER — Other Ambulatory Visit: Payer: Self-pay

## 2019-12-02 ENCOUNTER — Ambulatory Visit
Admission: RE | Admit: 2019-12-02 | Discharge: 2019-12-02 | Disposition: A | Discharge status: Home / Self Care | Payer: BC Managed Care – PPO | Source: Ambulatory Visit | Attending: Family Medicine | Admitting: Family Medicine

## 2019-12-02 DIAGNOSIS — R928 Other abnormal and inconclusive findings on diagnostic imaging of breast: Secondary | ICD-10-CM | POA: Diagnosis not present

## 2019-12-02 DIAGNOSIS — N644 Mastodynia: Secondary | ICD-10-CM

## 2019-12-02 DIAGNOSIS — N6489 Other specified disorders of breast: Secondary | ICD-10-CM | POA: Diagnosis not present

## 2019-12-06 ENCOUNTER — Ambulatory Visit (INDEPENDENT_AMBULATORY_CARE_PROVIDER_SITE_OTHER): Payer: BC Managed Care – PPO | Admitting: Family Medicine

## 2019-12-06 ENCOUNTER — Other Ambulatory Visit: Payer: Self-pay

## 2019-12-06 ENCOUNTER — Encounter: Payer: Self-pay | Admitting: Family Medicine

## 2019-12-06 VITALS — BP 122/80 | HR 74 | Temp 97.9°F | Ht 63.0 in | Wt 169.5 lb

## 2019-12-06 DIAGNOSIS — R519 Headache, unspecified: Secondary | ICD-10-CM | POA: Insufficient documentation

## 2019-12-06 DIAGNOSIS — R21 Rash and other nonspecific skin eruption: Secondary | ICD-10-CM | POA: Diagnosis not present

## 2019-12-06 DIAGNOSIS — Z13220 Encounter for screening for lipoid disorders: Secondary | ICD-10-CM

## 2019-12-06 DIAGNOSIS — D508 Other iron deficiency anemias: Secondary | ICD-10-CM

## 2019-12-06 NOTE — Patient Instructions (Signed)
Rash - consider hydrocortisone cream   Headache - blood work - keep me posted if this gets worse  Anemia - repeat labs - phone call later this week

## 2019-12-06 NOTE — Progress Notes (Signed)
Subjective:     [name] is a 49 y.o. female presenting for Rash (around neck and eyelid x 1 week *mostly gone now)     HPI  #Rash - around neck and on eyelids - present for 1 week - was itchy - scratched the eyelid - with mild swelling - treatment: antiseptic soap  - did not use lotions or creams - last year this occurred several times  Yesterday had some chills  Endorses some HA on the right side - frontal and posterior HA - mom with hx of stroke - HA occurring over the last 2-3 months, up to 3 times a week, lasts for 20 minutes - will occasionally take an ASA w/ improvement - pulsititle - no sensitivity to light or sound  Review of Systems   Social History   Tobacco Use  Smoking Status Never Smoker  Smokeless Tobacco Never Used        Objective:    BP Readings from Last 3 Encounters:  12/06/19 122/80  11/01/19 110/78  06/28/19 118/84   Wt Readings from Last 3 Encounters:  12/06/19 169 lb 8 oz (76.9 kg)  11/01/19 165 lb 12 oz (75.2 kg)  06/28/19 159 lb 4 oz (72.2 kg)    BP 122/80   Pulse 74   Temp 97.9 F (36.6 C) (Temporal)   Ht 5\' 3"  (1.6 m)   Wt 169 lb 8 oz (76.9 kg)   LMP 12/06/2014 (LMP Unknown)   SpO2 100%   BMI 30.03 kg/m    Physical Exam Constitutional:      General: She is not in acute distress.    Appearance: She is well-developed. She is not diaphoretic.  HENT:     Right Ear: External ear normal.     Left Ear: External ear normal.     Nose: Nose normal.  Eyes:     Conjunctiva/sclera: Conjunctivae normal.  Cardiovascular:     Rate and Rhythm: Normal rate and regular rhythm.     Heart sounds: No murmur heard.   Pulmonary:     Effort: Pulmonary effort is normal. No respiratory distress.     Breath sounds: Normal breath sounds. No wheezing.  Musculoskeletal:     Cervical back: Neck supple.  Skin:    General: Skin is warm and dry.     Capillary Refill: Capillary refill takes less than 2 seconds.     Comments: No  erythema, no swelling, no rashes  Neurological:     General: No focal deficit present.     Mental Status: She is alert. Mental status is at baseline.     Cranial Nerves: No cranial nerve deficit.     Motor: No weakness.     Coordination: Coordination normal.  Psychiatric:        Mood and Affect: Mood normal.        Behavior: Behavior normal.        Thought Content: Thought content normal.           Assessment & Plan:   Problem List Items Addressed This Visit      Other   Anemia, iron deficiency - Primary    No active bleeding. Colonoscopy 2020 was reassuring. Will repeat given HA and recently low and not on supplement.       Relevant Orders   CBC   Ferritin   Nonintractable episodic headache    Short lasting HA. No alarming signs on exam. FmHx of stroke so will check lipids. Neurology  referral if persisting or more frequent.        Other Visit Diagnoses    Screening for hyperlipidemia       Relevant Orders   Lipid panel   Rash         Suspect rash was a dermatitis given quick resolution. Recommended return if needed or hydrocortisone cream prn.   Return if symptoms worsen or fail to improve.  Lesleigh Noe, MD  This visit occurred during the SARS-CoV-2 public health emergency.  Safety protocols were in place, including screening questions prior to the visit, additional usage of staff PPE, and extensive cleaning of exam room while observing appropriate contact time as indicated for disinfecting solutions.

## 2019-12-06 NOTE — Assessment & Plan Note (Signed)
Short lasting HA. No alarming signs on exam. FmHx of stroke so will check lipids. Neurology referral if persisting or more frequent.

## 2019-12-06 NOTE — Assessment & Plan Note (Signed)
No active bleeding. Colonoscopy 2020 was reassuring. Will repeat given HA and recently low and not on supplement.

## 2019-12-07 LAB — CBC
HCT: 35.9 % — ABNORMAL LOW (ref 36.0–46.0)
Hemoglobin: 11.7 g/dL — ABNORMAL LOW (ref 12.0–15.0)
MCHC: 32.5 g/dL (ref 30.0–36.0)
MCV: 89.3 fl (ref 78.0–100.0)
Platelets: 211 10*3/uL (ref 150.0–400.0)
RBC: 4.02 Mil/uL (ref 3.87–5.11)
RDW: 14.9 % (ref 11.5–15.5)
WBC: 5.7 10*3/uL (ref 4.0–10.5)

## 2019-12-07 LAB — LIPID PANEL
Cholesterol: 150 mg/dL (ref 0–200)
HDL: 58.5 mg/dL (ref >39.00)
LDL Cholesterol: 75 mg/dL (ref 0–99)
NonHDL: 91.47
Total CHOL/HDL Ratio: 3
Triglycerides: 80 mg/dL (ref 0.0–149.0)
VLDL: 16 mg/dL (ref 0.0–40.0)

## 2019-12-07 LAB — FERRITIN: Ferritin: 55.9 ng/mL (ref 10.0–291.0)

## 2019-12-13 ENCOUNTER — Ambulatory Visit (INDEPENDENT_AMBULATORY_CARE_PROVIDER_SITE_OTHER): Payer: BC Managed Care – PPO | Admitting: Obstetrics & Gynecology

## 2019-12-13 ENCOUNTER — Other Ambulatory Visit: Payer: Self-pay

## 2019-12-13 ENCOUNTER — Encounter: Payer: Self-pay | Admitting: Obstetrics & Gynecology

## 2019-12-13 VITALS — BP 108/70 | HR 88 | Ht 63.0 in | Wt 169.4 lb

## 2019-12-13 DIAGNOSIS — N898 Other specified noninflammatory disorders of vagina: Secondary | ICD-10-CM

## 2019-12-13 NOTE — Progress Notes (Signed)
   GYNECOLOGY OFFICE VISIT NOTE  History:   [name] is a 49 y.o. P[employer] female referred here for second evaluation for vaginal lump.  Patient was seen by Dr. Einar Pheasant on 11/01/2019 and reported she has always felt a pea sized lump in her vaginal wall for several years. No associated symptoms, just wants to make sure it is okay.  On exam by Dr. Midge Minium, nothing was seen or palpated, so she was sent here for a here today for a second exam/evaluation.  Patient denies any associated symptoms or concerns.    Past Medical History:  Diagnosis Date  . Allergy   . Anemia   . Hypertension    no meds hx of  . Pre-diabetes   . Thyroid cancer ([place])   . Uterine fibroid     Past Surgical History:  Procedure Laterality Date  . CESAREAN SECTION     x 3  . THYROIDECTOMY N/A 06/07/2019   Procedure: TOTAL THYROIDECTOMY;  Surgeon: Mickeal Skinner, MD;  Location: [facility];  Service: General;  Laterality: N/A;  . TUBAL LIGATION      The following portions of the patient's history were reviewed and updated as appropriate: allergies, current medications, past family history, past medical history, past social history, past surgical history and problem list.   Health Maintenance:  Normal pap and negative HRHPV on 08/10/2015  Review of Systems:  Pertinent items noted in HPI and remainder of comprehensive ROS otherwise negative.  Physical Exam:  BP 108/70   Pulse 88   Ht 5\' 3"  (1.6 m)   Wt 169 lb 6.4 oz (76.8 kg)   LMP 12/06/2014 (LMP Unknown)   BMI 30.01 kg/m  CONSTITUTIONAL: Well-developed, well-nourished female in no acute distress.  MUSCULOSKELETAL: Normal range of motion. No edema noted. NEUROLOGIC: Alert and oriented to person, place, and time. Normal muscle tone coordination. No cranial nerve deficit noted. PSYCHIATRIC: Normal mood and affect. Normal behavior. Normal judgment and thought content. CARDIOVASCULAR: Normal heart rate noted RESPIRATORY: Effort and breath sounds normal, no problems  with respiration noted ABDOMEN: No masses noted. No other overt distention noted.   PELVIC: Normal appearing external genitalia; normal urethral meatus; normal appearing vaginal mucosa  and cervix with mild atrophy.  No abnormal discharge noted.  Small 7 mm round mobile lesion palpated under the vaginal mucosa about 5 cm from introitus, at 1 o' clock position under the pubic arch. Nontender.  Not able to seen on speculum exam. No other palpable masses, no uterine or adnexal tenderness. Performed in the presence of a chaperone     Assessment and Plan:      1. Vaginal lesion Benign lesion under vaginal mucosa, not concerning.  Reassured patient. She will follow up for her pap smear and annual exam or any other gynecologic issues.  Routine preventative health maintenance measures emphasized. Please refer to After Visit Summary for other counseling recommendations.   Return for any gynecologic concerns.    Total face-to-face time with patient: 20 minutes.  Over 50% of encounter was spent on counseling and coordination of care.   Verita Schneiders, MD, [place] for [employer], [place]

## 2019-12-13 NOTE — Patient Instructions (Signed)
Return to clinic for any scheduled appointments or for any gynecologic concerns as needed.   

## 2020-02-29 DIAGNOSIS — C73 Malignant neoplasm of thyroid gland: Secondary | ICD-10-CM | POA: Diagnosis not present

## 2020-02-29 DIAGNOSIS — E890 Postprocedural hypothyroidism: Secondary | ICD-10-CM | POA: Diagnosis not present

## 2020-03-03 DIAGNOSIS — E890 Postprocedural hypothyroidism: Secondary | ICD-10-CM | POA: Diagnosis not present

## 2020-03-03 DIAGNOSIS — Z85850 Personal history of malignant neoplasm of thyroid: Secondary | ICD-10-CM | POA: Diagnosis not present

## 2020-04-24 ENCOUNTER — Telehealth: Payer: Self-pay

## 2020-04-24 NOTE — Telephone Encounter (Signed)
[place] Night - Client Nonclinical Telephone Record AccessNurse Client [place] Night - Client Client Site [place] Physician Waunita Schooner- MD Contact Type Call Who Is Calling Patient / Member / Family / Caregiver Caller Name Bond Phone Number [phone] Patient Name [name] Patient DOB June 07, 1970 Call Type Message Only Information Provided Reason for Call Request to Schedule Office Appointment Initial Comment Caller says that she wants to make an appt. Disp. Time Disposition Final User 04/21/2020 5:15:35 PM General Information Provided Yes Norton Blizzard Call Closed By: Norton Blizzard Transaction Date/Time: 04/21/2020 5:13:16 PM (ET)

## 2020-04-24 NOTE — Telephone Encounter (Signed)
If the sinus symptoms are new would need virtual visit. She could also get a PCR test for Covid and submit negative results and schedule in-person after results come back.

## 2020-04-24 NOTE — Telephone Encounter (Signed)
Called patient to schedule OV. Pt stated she is having headaches, sinus issues, and experiencing some leg heaviness. Expressed to patient that virtual visit is needed with the symptoms presenting. Pt stated she would like to discuss with PCP in office as she stated she is not having covid symptoms and feels the tip of her nose is swollen. Pt declined virtual visit be made till asking PCP if she can come in. Please advise.

## 2020-04-26 NOTE — Telephone Encounter (Signed)
Spoke to pt. She states that she no longer has any of these symptoms but would still like to be seen for headaches that worried her. She gets covid tested twice a week at work and can provide negative test results. Pt scheduled for CPE on 05/11/20.

## 2020-05-11 ENCOUNTER — Ambulatory Visit (INDEPENDENT_AMBULATORY_CARE_PROVIDER_SITE_OTHER): Payer: BC Managed Care – PPO | Admitting: Family Medicine

## 2020-05-11 ENCOUNTER — Other Ambulatory Visit: Payer: Self-pay

## 2020-05-11 VITALS — BP 102/70 | HR 75 | Temp 97.9°F | Ht 63.0 in | Wt 166.5 lb

## 2020-05-11 DIAGNOSIS — R7303 Prediabetes: Secondary | ICD-10-CM | POA: Diagnosis not present

## 2020-05-11 DIAGNOSIS — Z1159 Encounter for screening for other viral diseases: Secondary | ICD-10-CM

## 2020-05-11 DIAGNOSIS — Z0000 Encounter for general adult medical examination without abnormal findings: Secondary | ICD-10-CM

## 2020-05-11 DIAGNOSIS — R519 Headache, unspecified: Secondary | ICD-10-CM

## 2020-05-11 DIAGNOSIS — L089 Local infection of the skin and subcutaneous tissue, unspecified: Secondary | ICD-10-CM | POA: Insufficient documentation

## 2020-05-11 DIAGNOSIS — G8929 Other chronic pain: Secondary | ICD-10-CM

## 2020-05-11 DIAGNOSIS — D649 Anemia, unspecified: Secondary | ICD-10-CM

## 2020-05-11 DIAGNOSIS — R208 Other disturbances of skin sensation: Secondary | ICD-10-CM

## 2020-05-11 DIAGNOSIS — E890 Postprocedural hypothyroidism: Secondary | ICD-10-CM

## 2020-05-11 LAB — CBC
HCT: 35.7 % — ABNORMAL LOW (ref 36.0–46.0)
Hemoglobin: 11.8 g/dL — ABNORMAL LOW (ref 12.0–15.0)
MCHC: 33.2 g/dL (ref 30.0–36.0)
MCV: 87.2 fl (ref 78.0–100.0)
Platelets: 165 10*3/uL (ref 150.0–400.0)
RBC: 4.1 Mil/uL (ref 3.87–5.11)
RDW: 13.9 % (ref 11.5–15.5)
WBC: 4.7 10*3/uL (ref 4.0–10.5)

## 2020-05-11 LAB — HEMOGLOBIN A1C: Hgb A1c MFr Bld: 6.1 % (ref 4.6–6.5)

## 2020-05-11 LAB — VITAMIN B12: Vitamin B-12: 1045 pg/mL — ABNORMAL HIGH (ref 211–911)

## 2020-05-11 LAB — TSH: TSH: 0.6 u[IU]/mL (ref 0.35–4.50)

## 2020-05-11 NOTE — Patient Instructions (Addendum)
Cardiology number [id number]    Eye exam  Start drinking 4 bottles of water per day Try to get 7-8 hours of sleep Avoid taking medication more than 3 times week    General Headache Without Cause A headache is pain or discomfort that is felt around the head or neck area. There are many causes and types of headaches. In some cases, the cause may not be found. Follow these instructions at home: Watch your condition for any changes. Let your doctor know about them. Take these steps to help with your condition: Managing pain  Take over-the-counter and prescription medicines only as told by your doctor.  Lie down in a dark, quiet room when you have a headache.  If told, put ice on your head and neck area: ? Put ice in a plastic bag. ? Place a towel between your skin and the bag. ? Leave the ice on for 20 minutes, 2-3 times per day.  If told, put heat on the affected area. Use the heat source that your doctor recommends, such as a moist heat pack or a heating pad. ? Place a towel between your skin and the heat source. ? Leave the heat on for 20-30 minutes. ? Remove the heat if your skin turns bright red. This is very important if you are unable to feel pain, heat, or cold. You may have a greater risk of getting burned.  Keep lights dim if bright lights bother you or make your headaches worse.      Eating and drinking  Eat meals on a regular schedule.  If you drink alcohol: ? Limit how much you use to:  0-1 drink a day for women.  0-2 drinks a day for men. ? Be aware of how much alcohol is in your drink. In the [place] one drink equals one 12 oz bottle of beer (355 mL), one 5 oz glass of wine (148 mL), or one 1 oz glass of hard liquor (44 mL).  Stop drinking caffeine, or reduce how much caffeine you drink. General instructions  Keep a journal to find out if certain things bring on headaches. For example, write down: ? What you eat and drink. ? How much sleep you  get. ? Any change to your diet or medicines.  Get a massage or try other ways to relax.  Limit stress.  Sit up straight. Do not tighten (tense) your muscles.  Do not use any products that contain nicotine or tobacco. This includes cigarettes, e-cigarettes, and chewing tobacco. If you need help quitting, ask your doctor.  Exercise regularly as told by your doctor.  Get enough sleep. This often means 7-9 hours of sleep each night.  Keep all follow-up visits as told by your doctor. This is important.   Contact a doctor if:  Your symptoms are not helped by medicine.  You have a headache that feels different than the other headaches.  You feel sick to your stomach (nauseous) or you throw up (vomit).  You have a fever. Get help right away if:  Your headache gets very bad quickly.  Your headache gets worse after a lot of physical activity.  You keep throwing up.  You have a stiff neck.  You have trouble seeing.  You have trouble speaking.  You have pain in the eye or ear.  Your muscles are weak or you lose muscle control.  You lose your balance or have trouble walking.  You feel like you will pass out (faint) or  you pass out.  You are mixed up (confused).  You have a seizure. Summary  A headache is pain or discomfort that is felt around the head or neck area.  There are many causes and types of headaches. In some cases, the cause may not be found.  Keep a journal to help find out what causes your headaches. Watch your condition for any changes. Let your doctor know about them.  Contact a doctor if you have a headache that is different from usual, or if your headache is not helped by medicine.  Get help right away if your headache gets very bad, you throw up, you have trouble seeing, you lose your balance, or you have a seizure. This information is not intended to replace advice given to you by your health care provider. Make sure you discuss any questions you  have with your health care provider. Document Revised: 09/22/2017 Document Reviewed: 09/22/2017 Elsevier Patient Education  [place].   Form - Headache Record There are many types and causes of headaches. A headache record can help guide your treatment plan. Use this form to record the details. Bring this form with you to your follow-up visits. Follow your health care provider's instructions on how to describe your headache. You may be asked to:  Use a pain scale. This is a tool to rate the intensity of your headache using words or numbers.  Describe what your headache feels like, such as dull, achy, throbbing, or sharp. Headache record Date: _______________ Time (from start to end): ____________________ Location of the headache: _________________________  Intensity of the headache: ____________________ Description of the headache: ______________________________________________________________  Hours of sleep the night before the headache: __________  Food or drinks before the headache started: ______________________________________________________________________________________  Events before the headache started: _______________________________________________________________________________________________  Symptoms before the headache started: __________________________________________________________________________________________  Symptoms during the headache: __________________________________________________________________________________________________  Treatment: ________________________________________________________________________________________________________________  Effect of treatment: _________________________________________________________________________________________________________  Other comments: ___________________________________________________________________________________________________________ Date: _______________ Time (from start  to end): ____________________ Location of the headache: _________________________  Intensity of the headache: ____________________ Description of the headache: ______________________________________________________________  Hours of sleep the night before the headache: __________  Food or drinks before the headache started: ______________________________________________________________________________________  Events before the headache started: ____________________________________________________________________________________________  Symptoms before the headache started: _________________________________________________________________________________________  Symptoms during the headache: _______________________________________________________________________________________________  Treatment: ________________________________________________________________________________________________________________  Effect of treatment: _________________________________________________________________________________________________________  Other comments: ___________________________________________________________________________________________________________ Date: _______________ Time (from start to end): ____________________ Location of the headache: _________________________  Intensity of the headache: ____________________ Description of the headache: ______________________________________________________________  Hours of sleep the night before the headache: __________  Food or drinks before the headache started: ______________________________________________________________________________________  Events before the headache started: ____________________________________________________________________________________________  Symptoms before the headache started: _________________________________________________________________________________________  Symptoms during the headache:  _______________________________________________________________________________________________  Treatment: ________________________________________________________________________________________________________________  Effect of treatment: _________________________________________________________________________________________________________  Other comments: ___________________________________________________________________________________________________________ Date: _______________ Time (from start to end): ____________________ Location of the headache: _________________________  Intensity of the headache: ____________________ Description of the headache: ______________________________________________________________  Hours of sleep the night before the headache: _________  Food or drinks before the headache started: ______________________________________________________________________________________  Events before the headache started: ____________________________________________________________________________________________  Symptoms before the headache started: _________________________________________________________________________________________  Symptoms during the headache: _______________________________________________________________________________________________  Treatment: ________________________________________________________________________________________________________________  Effect of treatment: _________________________________________________________________________________________________________  Other comments: ___________________________________________________________________________________________________________ Date: _______________ Time (from start to end): ____________________ Location of the headache: _________________________  Intensity of the headache: ____________________ Description of the headache:  ______________________________________________________________  Hours of sleep the night before the headache: _________  Food or drinks before  the headache started: ______________________________________________________________________________________  Events before the headache started: ____________________________________________________________________________________________  Symptoms before the headache started: _________________________________________________________________________________________  Symptoms during the headache: _______________________________________________________________________________________________  Treatment: ________________________________________________________________________________________________________________  Effect of treatment: _________________________________________________________________________________________________________  Other comments: ___________________________________________________________________________________________________________ This information is not intended to replace advice given to you by your health care provider. Make sure you discuss any questions you have with your health care provider. Document Revised: 03/23/2018 Document Reviewed: 03/23/2018 Elsevier Patient Education  [place].

## 2020-05-11 NOTE — Progress Notes (Signed)
Annual Exam   Chief Complaint:  Chief Complaint  Patient presents with  . Annual Exam    C/o headaches     History of Present Illness:  [name] is a 50 y.o. [id number] who LMP was Patient's last menstrual period was 12/06/2014 (lmp unknown)., presents today for her annual examination.    #Headaches - started around 01/2020 - had one episode at night  - HA occurring 3-4 times per week - will take an aspirin or motrin at least 3 times week - the HA will go away and them come back later on - has more stress - mom had a stroke and she is more scared about this - also noticing more forgetfulness - drinking 3 bottles of water per day - has noticed blurry vision for at least 1 year - last eye exam was 3 years ago  Nutrition She does get adequate calcium and Vitamin D in her diet. Diet: fist and veggies, low carbs, limits red meat in diet Exercise: walking after every meal for 10 minutes  Safety The patient wears seatbelts: yes.     The patient feels safe at home and in their relationships: yes.   Menstrual:  Symptoms of menopause: none   GYN She is single partner, contraception - post menopausal status.    Cervical Cancer Screening (21-65):   Last Pap:   May 2017 Results were: no abnormalities /neg HPV DNA   Breast Cancer Screening (Age 25-74):  There is no FH of breast cancer. There is no FH of ovarian cancer. BRCA screening Not Indicated.  Last Mammogram: 11/2019 The patient does want a mammogram this year.    Colon Cancer Screening:  Age 66-75 yo - benefits outweigh the risk. Adults 61-85 yo who have never been screened benefit.  Benefits: 134000 people in 2016 will be diagnosed and 49,000 will die - early detection helps [place]: Complications 2/2 to colonoscopy High Risk (Colonoscopy): genetic disorder (Lynch syndrome or familial adenomatous polyposis), personal hx of IBD, previous adenomatous polyp, or previous colorectal cancer, FamHx start 10 years before  the age at diagnosis, increased in males and black race  Options:  FIT - looks for hemoglobin (blood in the stool) - specific and fairly sensitive - must be done annually Cologuard - looks for DNA and blood - more sensitive - therefore can have more false positives, every 3 years Colonoscopy - every 10 years if normal - sedation, bowl prep, must have someone drive you  Shared decision making and the patient had decided to do colonscopy 2020.   Social History   Tobacco Use  Smoking Status Never Smoker  Smokeless Tobacco Never Used    Lung Cancer Screening (Ages 54-65): not applicable   Weight Wt Readings from Last 3 Encounters:  05/11/20 166 lb 8 oz (75.5 kg)  12/13/19 169 lb 6.4 oz (76.8 kg)  12/06/19 169 lb 8 oz (76.9 kg)   Patient has high BMI  BMI Readings from Last 1 Encounters:  05/11/20 29.49 kg/m     Chronic disease screening Blood pressure monitoring:  BP Readings from Last 3 Encounters:  05/11/20 102/70  12/13/19 108/70  12/06/19 122/80    Lipid Monitoring: Indication for screening: age >12, obesity, diabetes, family hx, CV risk factors.  Lipid screening: Yes  Lab Results  Component Value Date   CHOL 150 12/06/2019   HDL 58.50 12/06/2019   LDLCALC 75 12/06/2019   TRIG 80.0 12/06/2019   CHOLHDL 3 12/06/2019  Diabetes Screening: age >40, overweight, family hx, PCOS, hx of gestational diabetes, at risk ethnicity Diabetes Screening screening: Yes  Lab Results  Component Value Date   HGBA1C 6.0 (H) 06/03/2019     Past Medical History:  Diagnosis Date  . Allergy   . Anemia   . Hypertension    no meds hx of  . Pre-diabetes   . Thyroid cancer ([place])   . Uterine fibroid     Past Surgical History:  Procedure Laterality Date  . CESAREAN SECTION     x 3  . THYROIDECTOMY N/A 06/07/2019   Procedure: TOTAL THYROIDECTOMY;  Surgeon: Kinsinger, Arta Bruce, MD;  Location: [facility];  Service: General;  Laterality: N/A;  . TUBAL LIGATION       Prior to Admission medications   Medication Sig Start Date End Date Taking? Authorizing Provider  levothyroxine (SYNTHROID) 88 MCG tablet Take 88 mcg by mouth daily. 06/27/19  Yes [provider]    Allergies  Allergen Reactions  . Other Shortness Of Breath  . Vicodin [Hydrocodone-Acetaminophen] Shortness Of Breath    Patient states she is able to take regular tylenol    Gynecologic History: Patient's last menstrual period was 12/06/2014 (lmp unknown).  Obstetric History: [id number]  Social History   Socioeconomic History  . Marital status: Married    Spouse name: Pilar Plate  . Number of children: 2  . Years of education: LPN and elementary education degree  . Highest education level: Not on file  Occupational History  . Not on file  Tobacco Use  . Smoking status: Never Smoker  . Smokeless tobacco: Never Used  Vaping Use  . Vaping Use: Never used  Substance and Sexual Activity  . Alcohol use: No  . Drug use: No  . Sexual activity: Yes    Birth control/protection: Post-menopausal  Other Topics Concern  . Not on file  Social History Narrative   Lives with Pilar Plate   Step son - Merry Proud   2 children - Quita Skye   Also her mother lives in the home   Enjoys: traveling to [place] for vacation [place]   Exercise: 7 minute intense exercise app that she does 2-3 times a week   Diet: avoids eating after 6 pm, avoids fruit due to prediabetes, eats veggies, meat   Social Determinants of Health   Financial Resource Strain: Not on file  Food Insecurity: Not on file  Transportation Needs: Not on file  Physical Activity: Not on file  Stress: Not on file  Social Connections: Not on file  Intimate Partner Violence: Not on file    Family History  Problem Relation Age of Onset  . Diabetes Mother   . Hypertension Mother   . Stroke Mother 87  . Heart failure Father        s/p heart transplant  . Leukemia Sister   . Hypertension Brother   . Pancreatic cancer Paternal  Grandmother   . Colon cancer Neg Hx   . Rectal cancer Neg Hx   . Stomach cancer Neg Hx     Review of Systems  Constitutional: Negative for chills and fever.  HENT: Negative for congestion and sore throat.   Eyes: Negative for blurred vision and double vision.  Respiratory: Negative for shortness of breath.   Cardiovascular: Negative for chest pain.  Gastrointestinal: Negative for heartburn, nausea and vomiting.  Genitourinary: Negative.   Musculoskeletal: Negative.  Negative for myalgias.  Skin: Negative for rash.  Neurological: Positive for headaches. Negative for dizziness.  Endo/Heme/Allergies: Does not bruise/bleed easily.  Psychiatric/Behavioral: Negative for depression. The patient is not nervous/anxious.      Physical Exam BP 102/70   Pulse 75   Temp 97.9 F (36.6 C) (Temporal)   Ht '5\' 3"'  (1.6 m)   Wt 166 lb 8 oz (75.5 kg)   LMP 12/06/2014 (LMP Unknown)   SpO2 98%   BMI 29.49 kg/m    BP Readings from Last 3 Encounters:  05/11/20 102/70  12/13/19 108/70  12/06/19 122/80      Physical Exam Constitutional:      General: She is not in acute distress.    Appearance: She is well-developed and well-nourished. She is not diaphoretic.  HENT:     Head: Normocephalic and atraumatic.     Right Ear: External ear normal.     Left Ear: External ear normal.     Nose: Nose normal.     Mouth/Throat:     Mouth: Oropharynx is clear and moist.  Eyes:     General: No scleral icterus.    Extraocular Movements: EOM normal.     Conjunctiva/sclera: Conjunctivae normal.  Cardiovascular:     Rate and Rhythm: Normal rate and regular rhythm.     Heart sounds: No murmur heard.   Pulmonary:     Effort: Pulmonary effort is normal. No respiratory distress.     Breath sounds: Normal breath sounds. No wheezing.  Abdominal:     General: Bowel sounds are normal. There is no distension.     Palpations: Abdomen is soft. There is no mass.     Tenderness: There is no abdominal  tenderness. There is no guarding or rebound.  Musculoskeletal:        General: No edema. Normal range of motion.     Cervical back: Neck supple.  Lymphadenopathy:     Cervical: No cervical adenopathy.  Skin:    General: Skin is warm and dry.     Capillary Refill: Capillary refill takes less than 2 seconds.  Neurological:     General: No focal deficit present.     Mental Status: She is alert and oriented to person, place, and time.     Cranial Nerves: No cranial nerve deficit.     Sensory: No sensory deficit.     Motor: No weakness.     Gait: Gait normal.     Deep Tendon Reflexes: Strength normal. Reflexes normal.  Psychiatric:        Mood and Affect: Mood and affect and mood normal.        Behavior: Behavior normal.        Thought Content: Thought content normal.     Results:  PHQ-9:  [place] Visit from 05/11/2020 in [place] at [place]  PHQ-9 Total Score 4      Depression screen [facility] 2/9 05/11/2020 01/13/2019 10/31/2017  Decreased Interest 3 0 0  Down, Depressed, Hopeless 0 0 0  PHQ - 2 Score 3 0 0  Altered sleeping 0 - -  Tired, decreased energy 0 - -  Change in appetite 0 - -  Feeling bad or failure about yourself  0 - -  Trouble concentrating 1 - -  Moving slowly or fidgety/restless 0 - -  Suicidal thoughts 0 - -  PHQ-9 Score 4 - -  Difficult doing work/chores Not difficult at all - -       Assessment: 50 y.o. [id number] female here for routine annual physical examination.  Plan: Problem List Items  Addressed This Visit      Musculoskeletal and Integument   Recurrent infection of skin    She notes recurrent lesions which come and go. Requested dermatology referral. No lesions present today.         Other   Prediabetes   Relevant Orders   Hemoglobin A1c   Chronic nonintractable headache    Advised hydration, vision check, increased sleep and HA diary.        Other Visit Diagnoses    Annual physical exam    -  Primary    Burning sensation of feet       Relevant Orders   Vitamin B12   CBC   Anemia, unspecified type       Relevant Orders   CBC   Need for hepatitis C screening test       Relevant Orders   Hepatitis C antibody   Post-surgical hypothyroidism       Relevant Orders   TSH      Screening: -- Blood pressure screen normal -- cholesterol screening: will obtain -- Weight screening: overweight: continue to monitor -- Diabetes Screening: will obtain -- Nutrition: Encouraged healthy diet  The 10-year ASCVD risk score Mikey Bussing DC Jr., et al., 2013) is: 0.7%   Values used to calculate the score:     Age: 80 years     Sex: Female     Is Non-Hispanic African American: Yes     Diabetic: No     Tobacco smoker: No     Systolic Blood Pressure: 208 mmHg     Is BP treated: No     HDL Cholesterol: 58.5 mg/dL     Total Cholesterol: 150 mg/dL  -- Statin therapy for Age 46-75 with CVD risk >7.5%  Psych -- Depression screening (PHQ-9):  [place] Office Visit from 05/11/2020 in [place] at [facility]  PHQ-9 Total Score 4      Depression screen [facility] 2/9 05/11/2020 01/13/2019 10/31/2017  Decreased Interest 3 0 0  Down, Depressed, Hopeless 0 0 0  PHQ - 2 Score 3 0 0  Altered sleeping 0 - -  Tired, decreased energy 0 - -  Change in appetite 0 - -  Feeling bad or failure about yourself  0 - -  Trouble concentrating 1 - -  Moving slowly or fidgety/restless 0 - -  Suicidal thoughts 0 - -  PHQ-9 Score 4 - -  Difficult doing work/chores Not difficult at all - -      Safety -- tobacco screening: not using -- alcohol screening:  low-risk usage. -- no evidence of domestic violence or intimate partner violence.   Cancer Screening -- pap smear not collected per ASCCP guidelines -- family history of breast cancer screening: done. not at high risk. -- Mammogram - up to date -- Colon cancer (age 62+)-- up to date  Immunizations Immunization History  Administered Date(s)  Administered  . Influenza Whole 01/18/2008  . Influenza-Unspecified 12/16/2016  . PFIZER(Purple Top)SARS-COV-2 Vaccination 04/29/2019, 05/24/2019  . Td 09/15/2001, 01/07/2007  . Tdap 10/31/2017    -- flu vaccine declined -- TDAP q10 years up to date -- Covid-19 Vaccine up to date   Encouraged healthy diet and exercise. Encouraged regular vision and dental care.    Lesleigh Noe, MD

## 2020-05-11 NOTE — Assessment & Plan Note (Signed)
Advised hydration, vision check, increased sleep and HA diary.

## 2020-05-11 NOTE — Assessment & Plan Note (Signed)
She notes recurrent lesions which come and go. Requested dermatology referral. No lesions present today.

## 2020-05-12 LAB — HEPATITIS C ANTIBODY
Hepatitis C Ab: NONREACTIVE
SIGNAL TO CUT-OFF: 0.01 (ref <1.00)

## 2020-05-19 ENCOUNTER — Telehealth: Payer: Self-pay

## 2020-05-19 DIAGNOSIS — R519 Headache, unspecified: Secondary | ICD-10-CM

## 2020-05-19 NOTE — Telephone Encounter (Signed)
Pt lmovm to report that she is still concerned about her headaches and chills, they seem to be getting worse... Pt is requesting something be sent in for headaches  Pt also thinks she may have ringworm on her abdomen  Pt would like to have a call in reference to her recent lab results

## 2020-05-19 NOTE — Telephone Encounter (Signed)
Spoke to pt and relayed all of Dr. Verda Cumins recommendations. Pt is very concerned about headaches and would like a referral to have further evaluation.  Pt stated understanding on lab results.

## 2020-05-19 NOTE — Telephone Encounter (Signed)
Please call patient to relay the following.  Result note on recent labs from 2/24. Assuming there must have been difficulty reaching her or communication error for delay.  Headaches: would recommend she try a daily magnesium supplement.  Ringworm: would recommend appoint to evaluate

## 2020-05-22 NOTE — Addendum Note (Signed)
Addended by: Lesleigh Noe on: 05/22/2020 08:06 AM   Modules accepted: Orders

## 2020-05-22 NOTE — Telephone Encounter (Signed)
I've placed a referral to the headache clinic.   Though as we did try to discuss her headaches in the context of an annual physical exam it is possible we could re-evaluate and try some treatment options while she waits for the referral. I would recommend a follow-up appointment to discuss.

## 2020-05-24 NOTE — Telephone Encounter (Signed)
Called and relayed Dr. Verda Cumins message to patient who verbalized understanding. Patient stated that she will make a F/U appointment with Dr. Einar Pheasant if she does not get an appointment with the headache clinic in an adequate amount of time.

## 2020-07-02 IMAGING — US US BREAST*R* LIMITED INC AXILLA
1 series · 7 of 7 positions shown · non-contrast
Comparison: Previous exam(s).

CLINICAL DATA: 48-year-old female for evaluation of possible RIGHT
breast asymmetry on screening mammogram.

EXAM:
DIGITAL DIAGNOSTIC RIGHT MAMMOGRAM WITH CAD AND TOMO
ULTRASOUND RIGHT BREAST

[Series 1: us breast*right* limited inc axilla · 0.06mm/px · 7 of 7 slices shown]
[im 1/7]
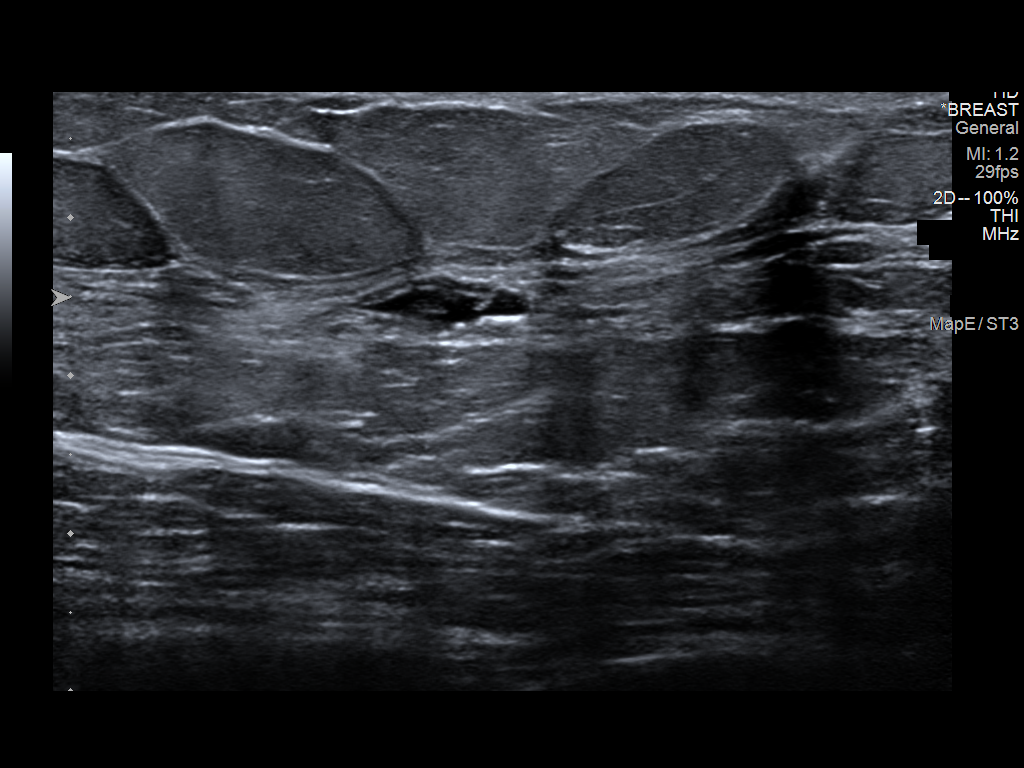
[im 2/7]
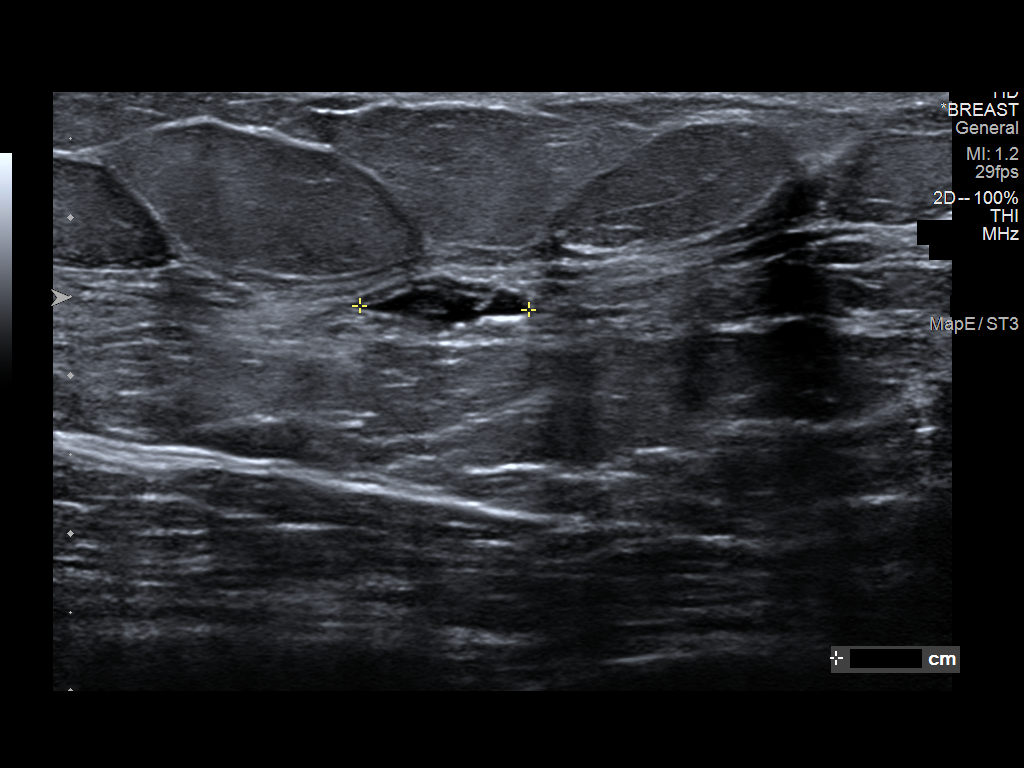
[im 3/7]
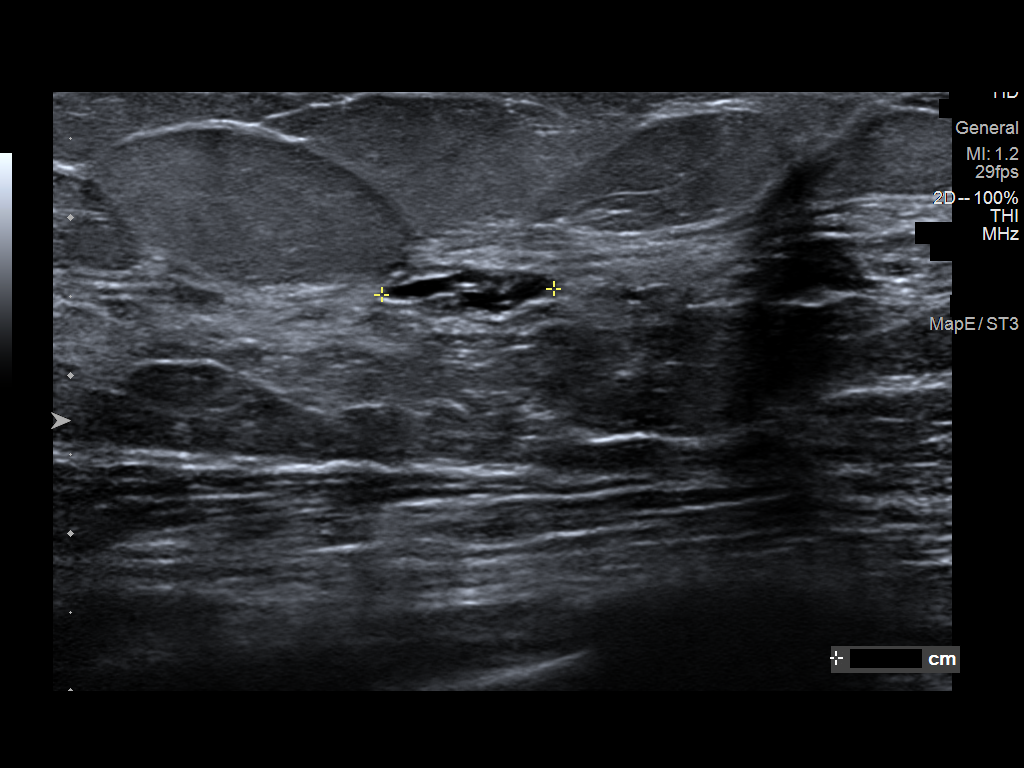
[im 4/7]
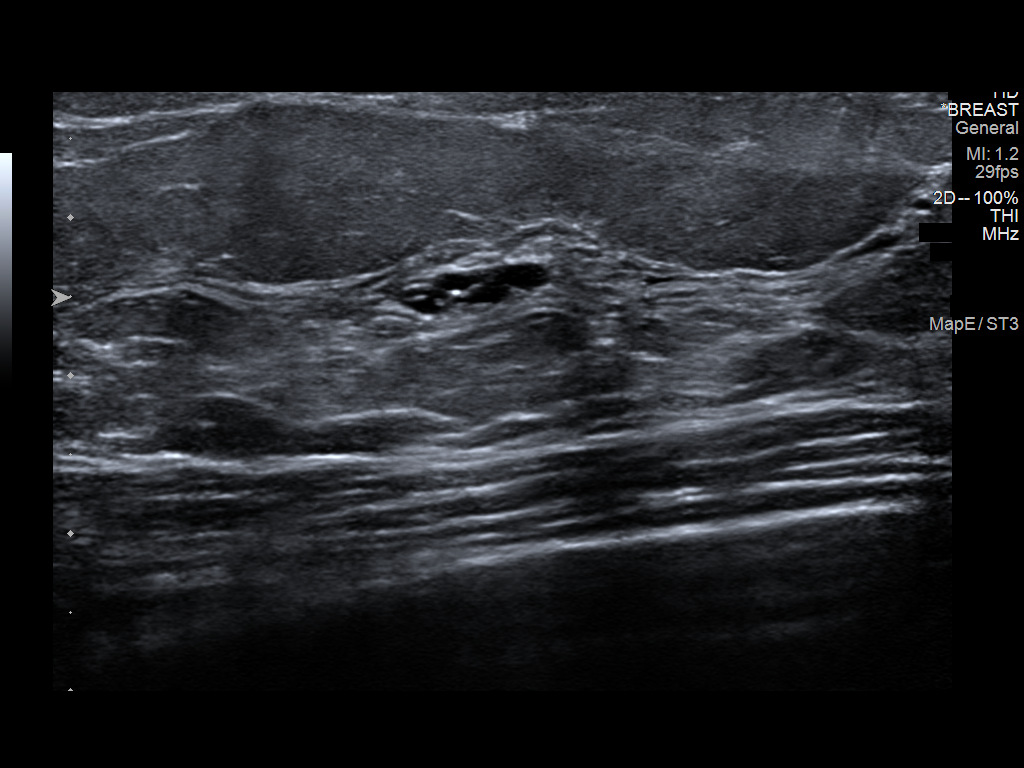
[im 5/7]
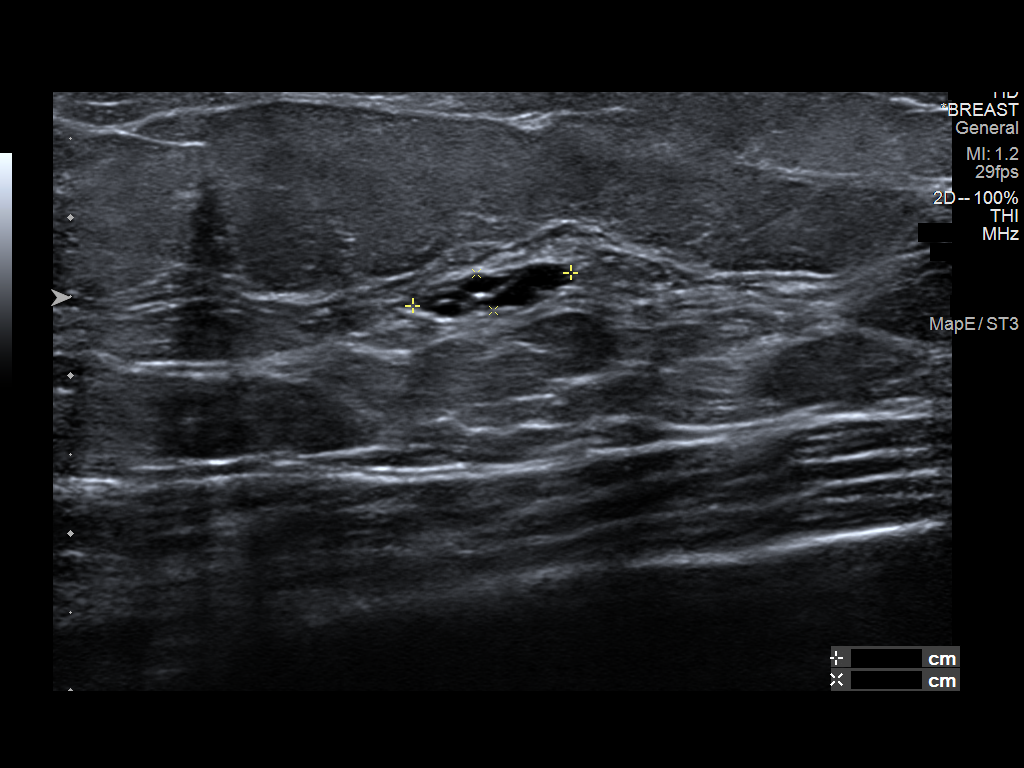
[im 6/7]
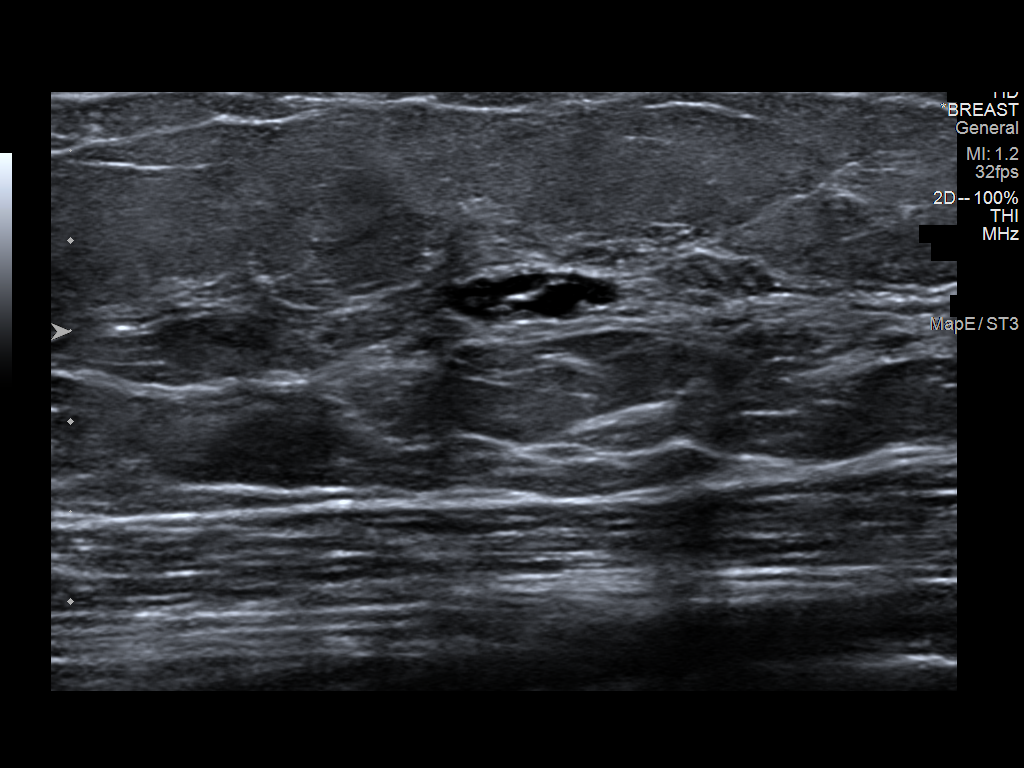
[im 7/7]
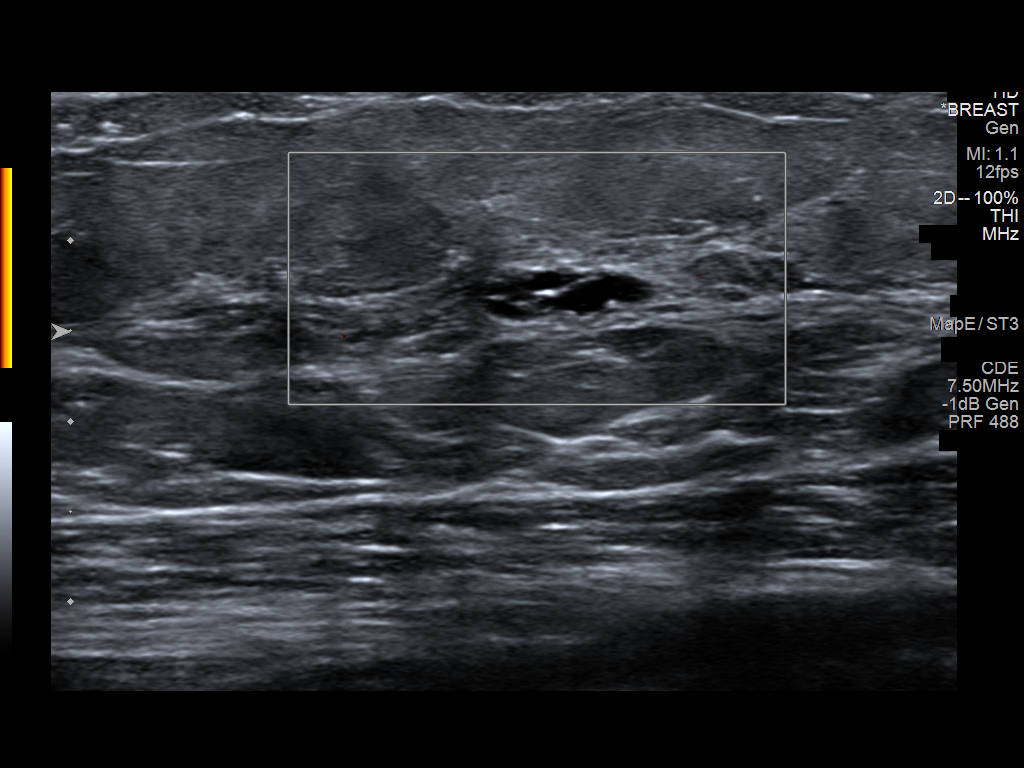

[7 of 7 positions shown; findings below may reference images not displayed]

ACR Breast Density Category b: There are scattered areas of
fibroglandular density.
FINDINGS: 2D/3D full field and spot compression views of the RIGHT breast
demonstrate less apparent asymmetry within the slightly UPPER RIGHT
breast.

Mammographic images were processed with CAD.

Targeted ultrasound is performed, showing a 1 x 0.3 x 1.1 cm cyst
with septations at the 12 o'clock position of the RIGHT breast 3 cm
from the nipple, likely representing the screening study finding.
IMPRESSION: 1. Likely benign cystic changes in the UPPER RIGHT breast. Six-month
follow-up recommended to ensure stability.

RECOMMENDATION:
RIGHT breast ultrasound in 6 months.

I have discussed the findings and recommendations with the patient.
If applicable, a reminder letter will be sent to the patient
regarding the next appointment.

BI-RADS CATEGORY  3: Probably benign.

## 2020-07-07 DIAGNOSIS — L42 Pityriasis rosea: Secondary | ICD-10-CM | POA: Diagnosis not present

## 2020-08-16 IMAGING — US US THYROID
1 series · 13 of 25 positions shown · non-contrast
Comparison: None.

CLINICAL DATA: Neck fullness

EXAM:
THYROID ULTRASOUND
TECHNIQUE: Ultrasound examination of the thyroid gland and adjacent soft
tissues was performed.

[Series 1: us thyroid · 0.05mm/px · 13 of 42 slices shown]
[im 1/42]
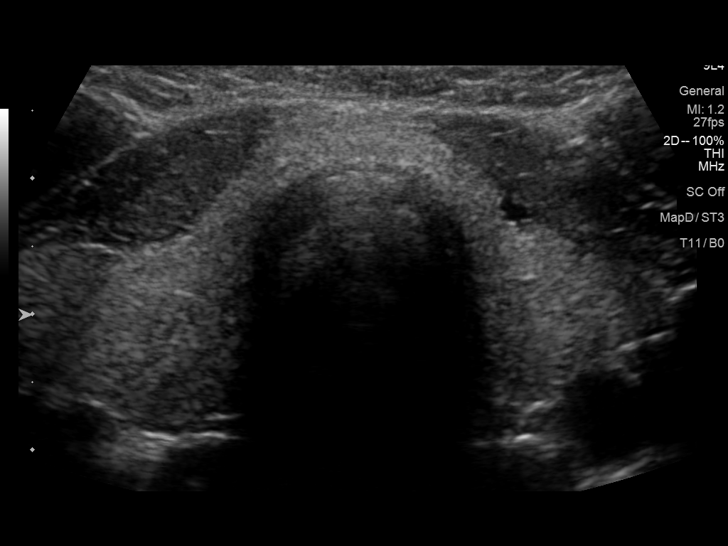
[im 4/42]
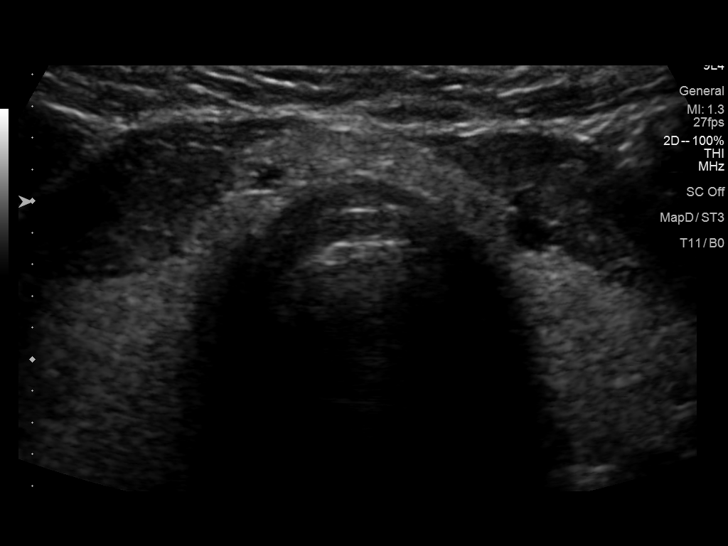
[im 7/42]
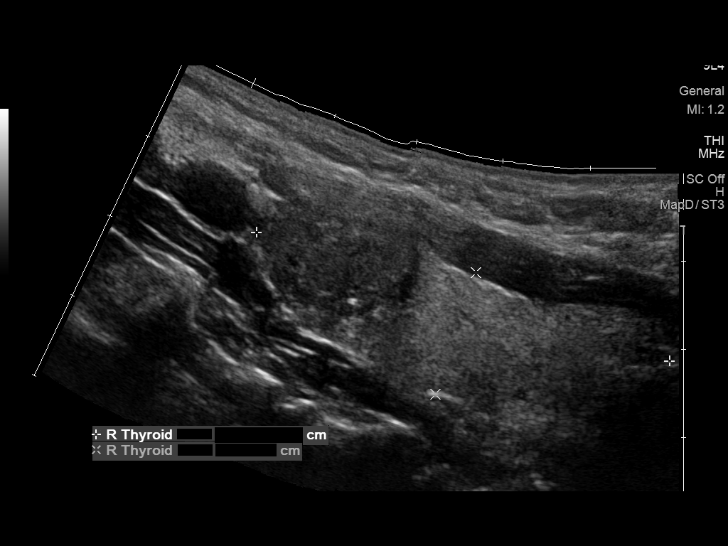
[im 11/42]
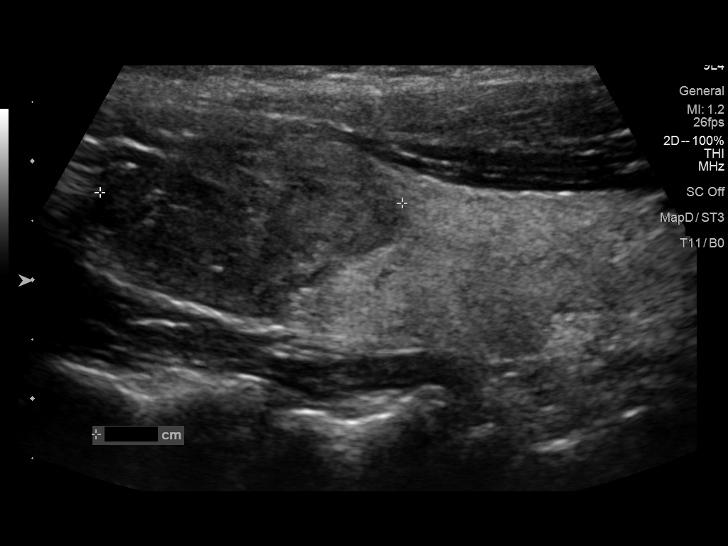
[im 14/42]
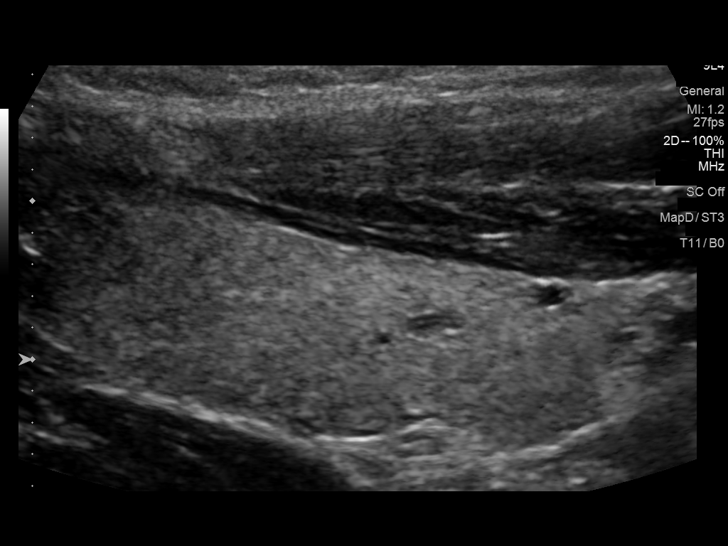
[im 18/42]
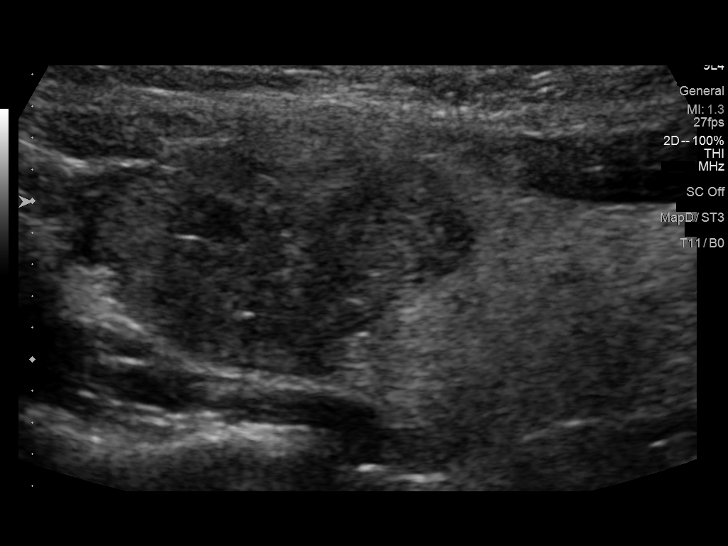
[im 21/42]
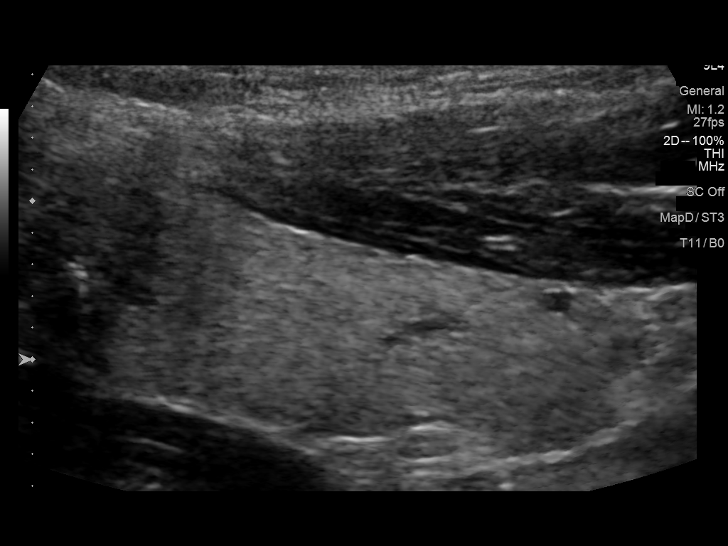
[im 24/42]
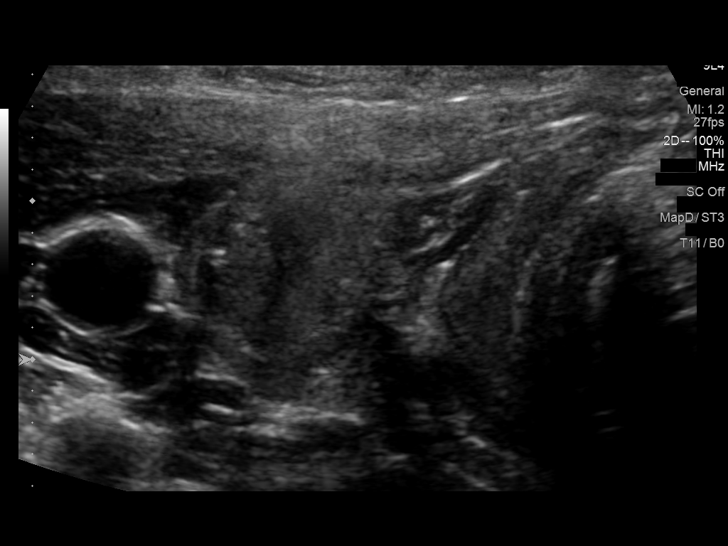
[im 28/42]
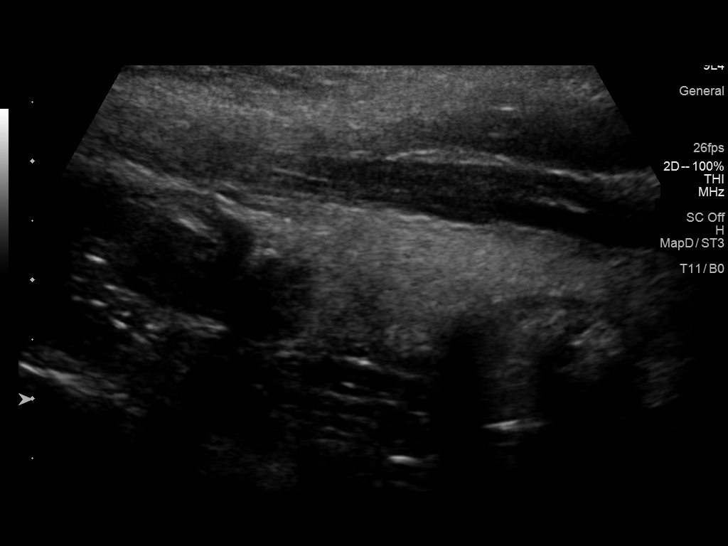
[im 31/42]
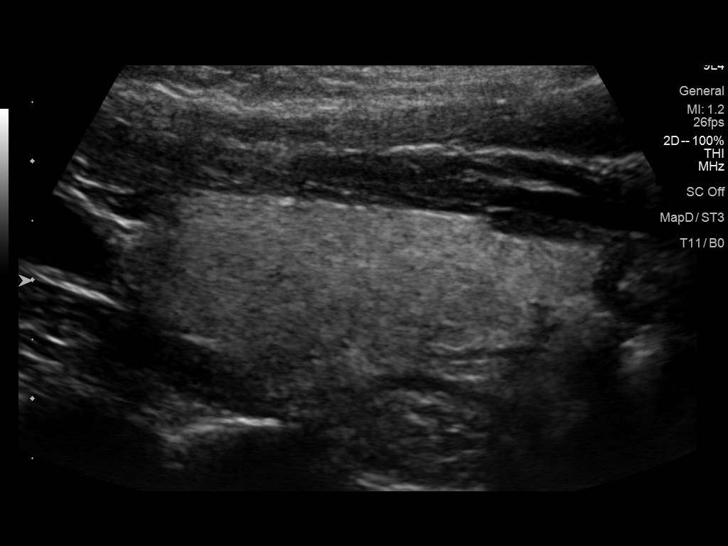
[im 35/42]
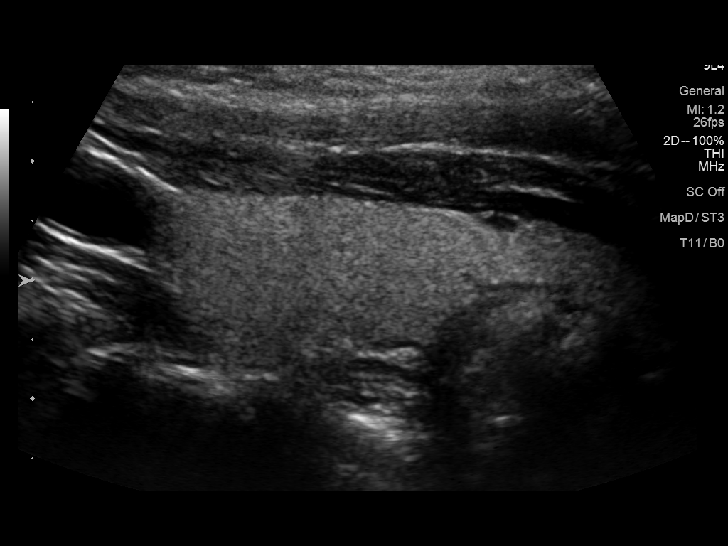
[im 38/42]
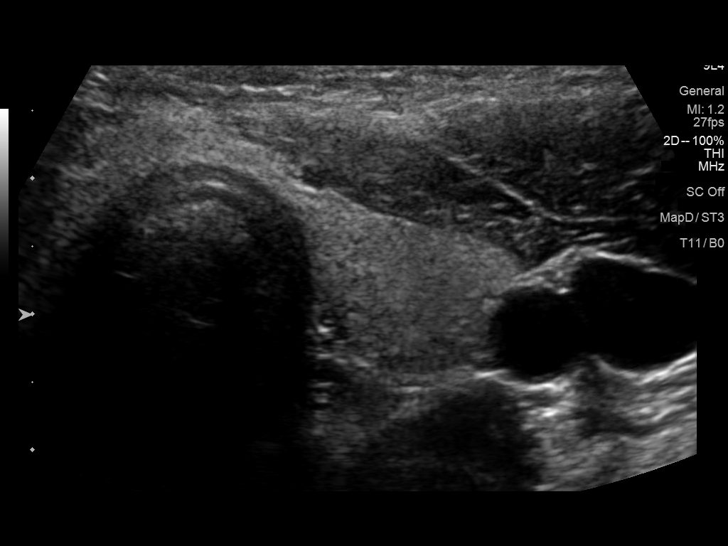
[im 42/42]
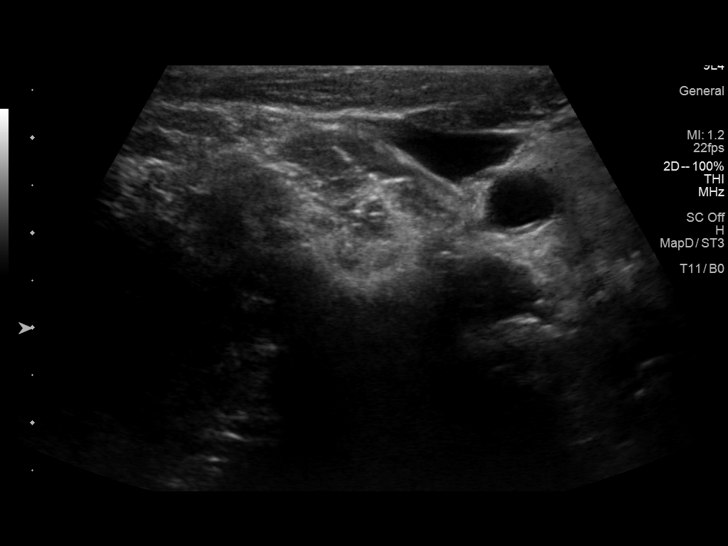

[13 of 25 positions shown; findings below may reference images not displayed]

FINDINGS: Parenchymal Echotexture: Normal

Isthmus: 0.5 cm

Right lobe: 4.9 x 1.5 x 2 cm

Left lobe: 4.4 x 1.3 x 1.6 cm

_________________________________________________________

Estimated total number of nodules >/= 1 cm: 1

Number of spongiform nodules >/=  2 cm not described below (TR1): 0

Number of mixed cystic and solid nodules >/= 1.5 cm not described
below (TR2): 0

_________________________________________________________

Nodule # 1:

Location: Right; Superior

Maximum size: 2.6 cm; Other 2 dimensions: 1.5 x 1.4 cm

Composition: solid/almost completely solid (2)

Echogenicity: hypoechoic (2)

Shape: taller-than-wide (3)

Margins: smooth (0)

Echogenic foci: punctate echogenic foci (3)

ACR TI-RADS total points: 10.

ACR TI-RADS risk category: TR5 (>/= 7 points).

ACR TI-RADS recommendations:

**Given size (>/= 1.0 cm) and appearance, fine needle aspiration of
this highly suspicious nodule should be considered based on TI-RADS
criteria.

_________________________________________________________

No suspicious lymph nodes were detected.
IMPRESSION: 1. Dominant 2.6 cm TR 5 thyroid nodule in the right superior thyroid
gland. FNA of this thyroid nodule is recommended.
2. Borderline enlarged thyroid gland.

The above is in keeping with the ACR TI-RADS recommendations - [HOSPITAL] 4835;[DATE].

## 2020-12-15 IMAGING — US US FNA BIOPSY THYROID 1ST LESION
1 series · 13 of 16 positions shown · non-contrast
Comparison: Ultrasound thyroid dated 01/18/2019

MEDICATIONS:
3 mL 1% lidocaine

COMPLICATIONS:
None immediate.

INDICATION: thyroid lobe nodule. Request for fine-needle aspiration.

EXAM:
ULTRASOUND GUIDED FINE NEEDLE ASPIRATION OF INDETERMINATE THYROID
NODULE
TECHNIQUE: Informed written consent was obtained from the patient after a
discussion of the risks, benefits and alternatives to treatment.
Questions regarding the procedure were encouraged and answered. A
timeout was performed prior to the initiation of the procedure.

[Series 1: us fna biopsy thyroid 1st lesion · 0.06mm/px · 16 acquisitions, 13 frames shown]
[im 1/16]
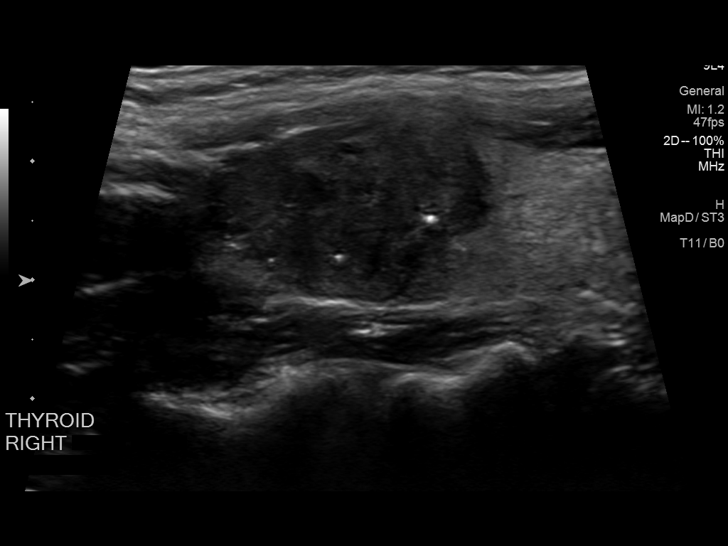
[im 2/16]
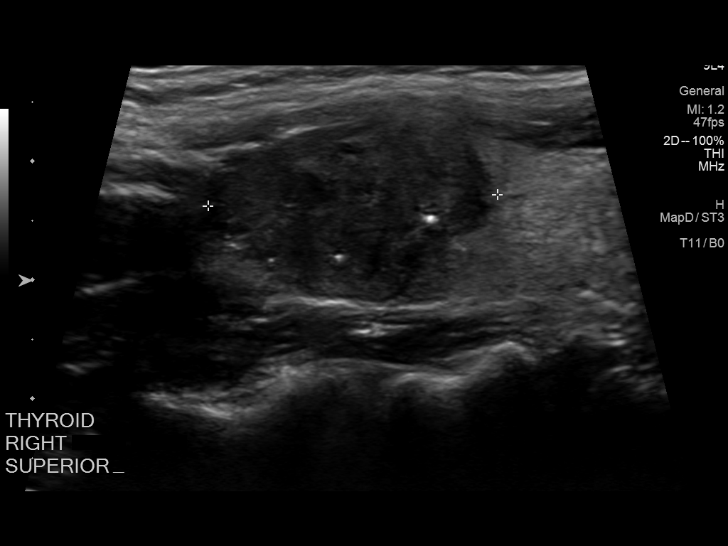
[im 4/16]
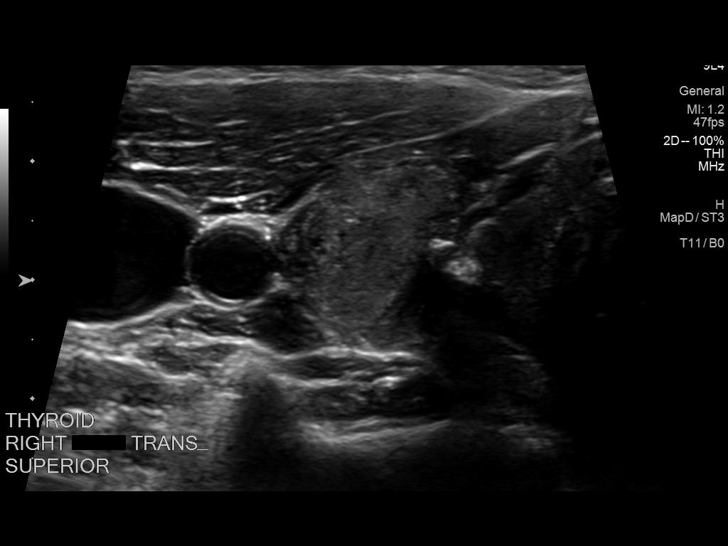
[im 5/16]
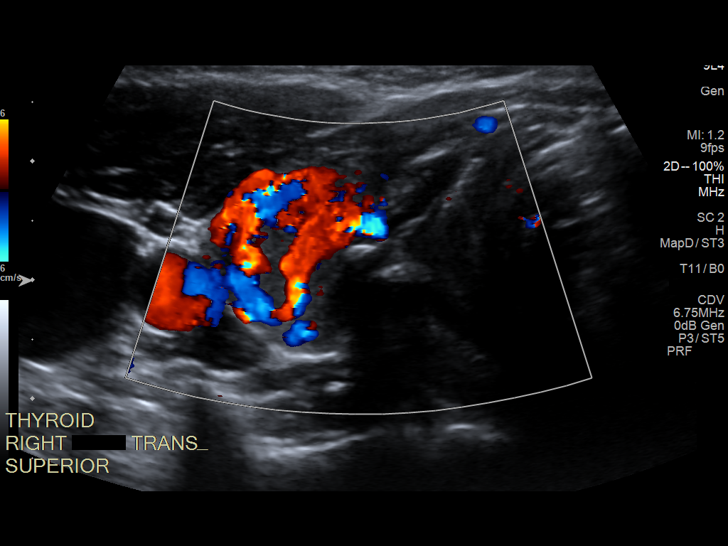
[im 6/16]
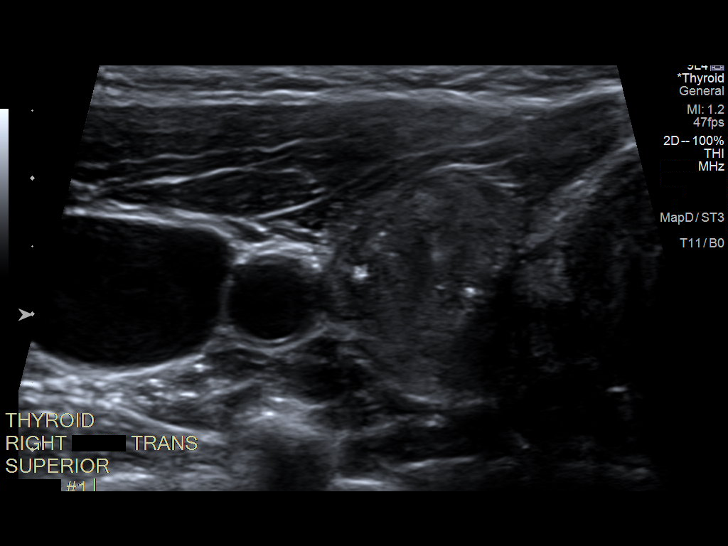
[im 7/16]
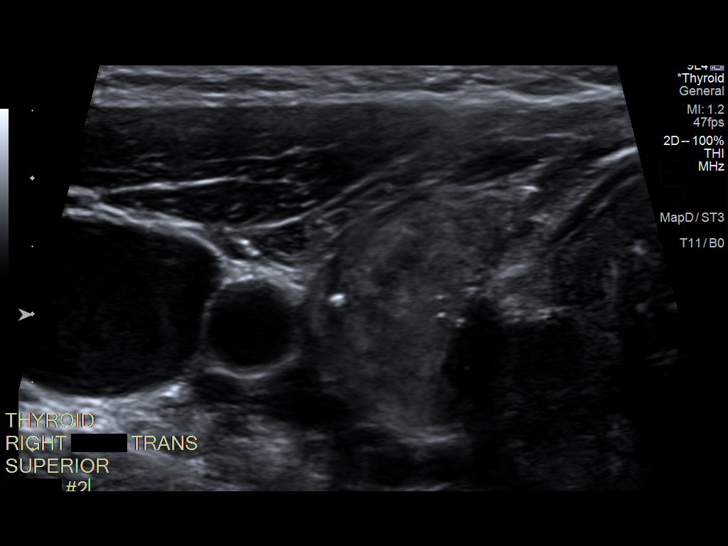
[im 9/16]
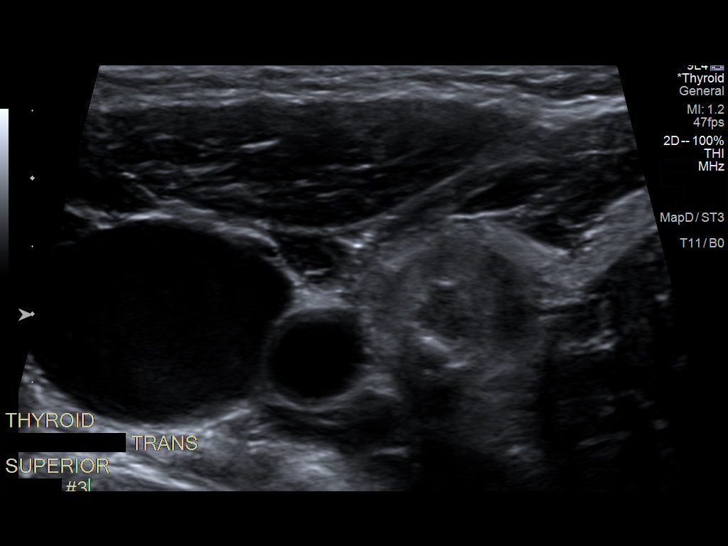
[im 10/16]
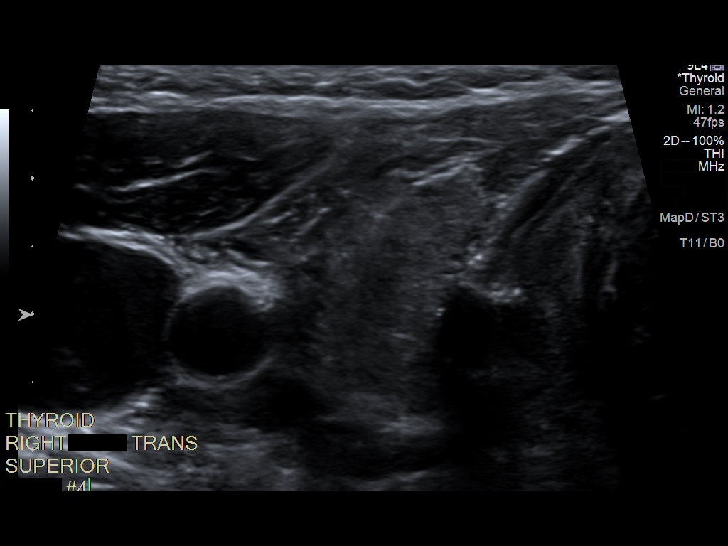
[im 11/16]
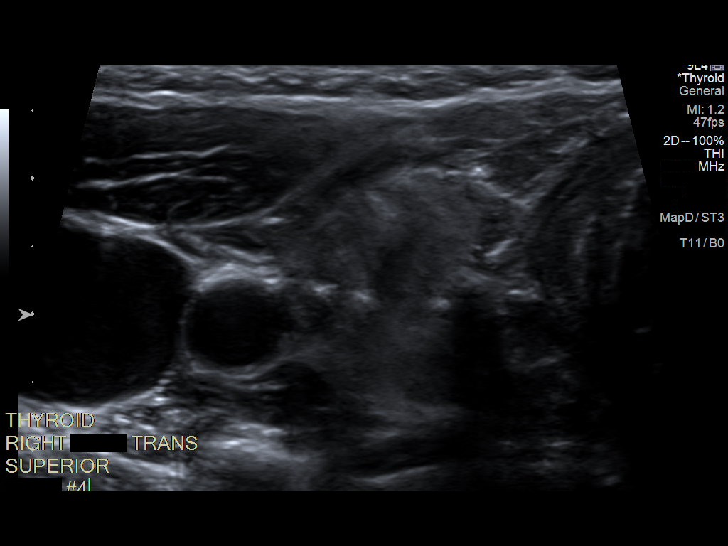
[im 12/16]
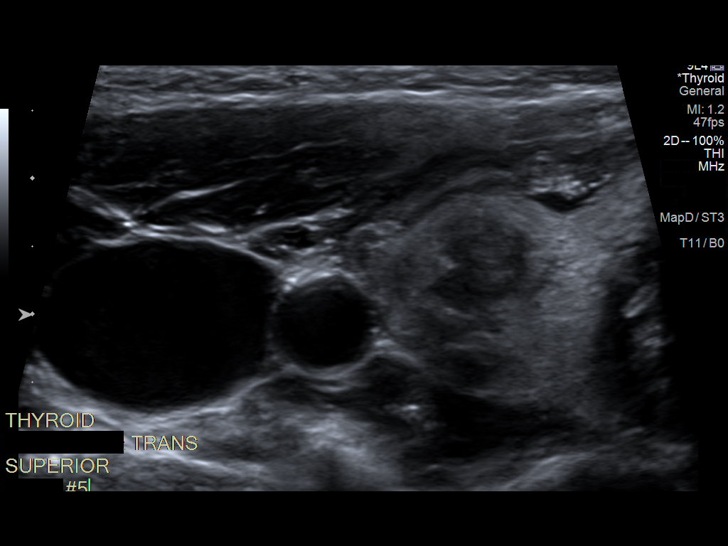
[im 13/16]
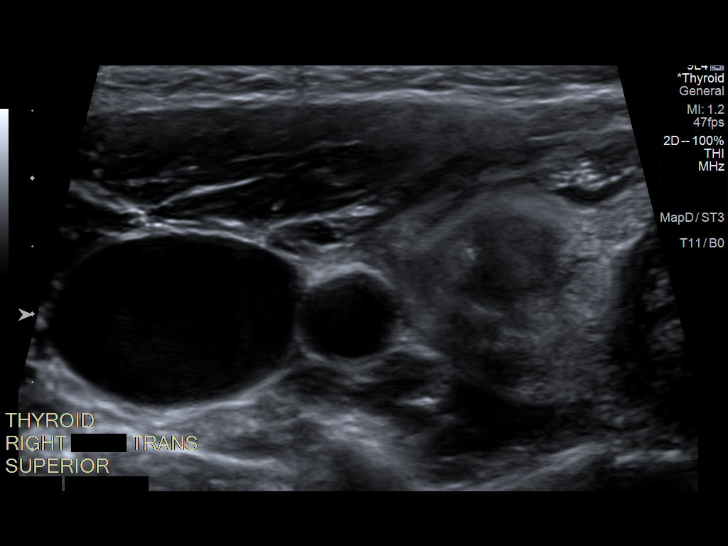
[im 15/16]
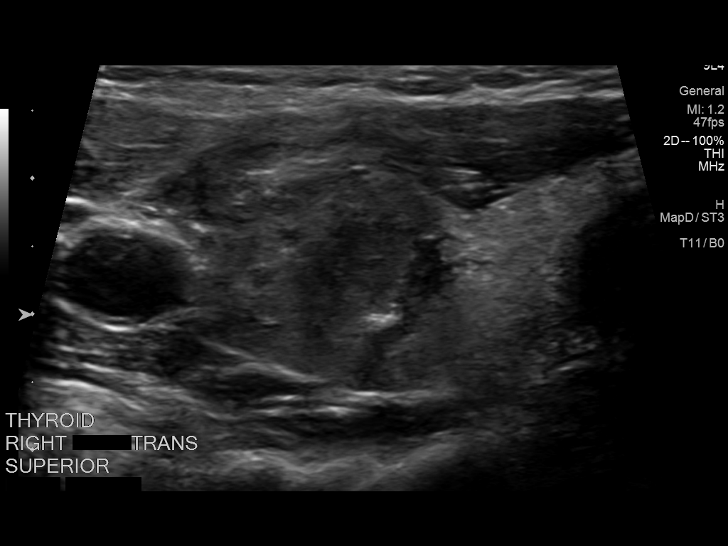
[im 16/16]
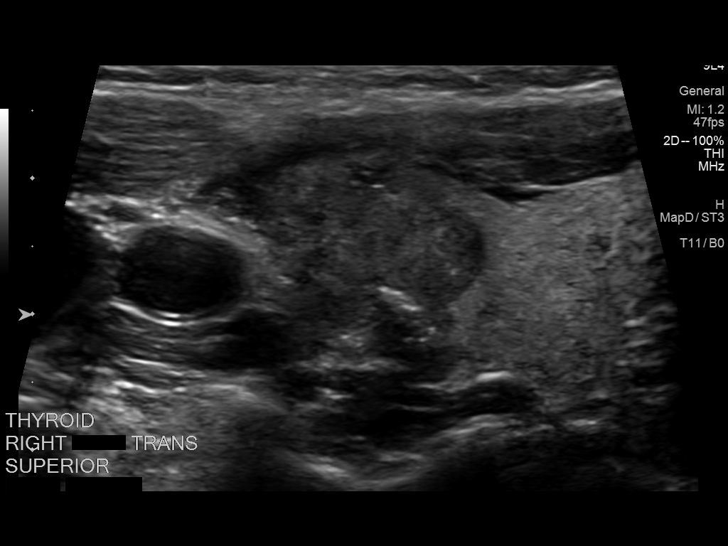

[13 of 16 positions shown; findings below may reference images not displayed]

Pre-procedural ultrasound scanning demonstrated unchanged size and
appearance of the indeterminate nodule within the right superior
thyroid lobe

The procedure was planned. The neck was prepped in the usual sterile
fashion, and a sterile drape was applied covering the operative
field. A timeout was performed prior to the initiation of the
procedure. Local anesthesia was provided with 1% lidocaine.

Under direct ultrasound guidance, 5 FNA biopsies were performed of
the indeterminate right thyroid nodule with a 25 gauge needle.
Multiple ultrasound images were saved for procedural documentation
purposes. The samples were prepared and submitted to pathology.

Limited post procedural scanning was negative for hematoma or
additional complication. Dressings were placed. The patient
tolerated the above procedures procedure well without immediate
postprocedural complication.
FINDINGS: Nodule reference number based on prior diagnostic ultrasound: 1

Maximum size: 2.6 cm

Location: Right; Superior

ACR TI-RADS risk category: TR5 (>/= 7 points)

Reason for biopsy: meets ACR TI-RADS criteria

Ultrasound imaging confirms appropriate placement of the needles
within the thyroid nodule.
IMPRESSION: Technically successful ultrasound guided fine needle aspiration of
indeterminate right thyroid nodule

Read by Niblett, Gurwinder

## 2020-12-30 IMAGING — DX DG CHEST 2V
2 series · 2 of 2 positions shown · non-contrast
Comparison: January 10, 2015

CLINICAL DATA: Preoperative assessment for thyroid surgery.
Hypertension.

EXAM:
CHEST - 2 VIEW

[chest pa]
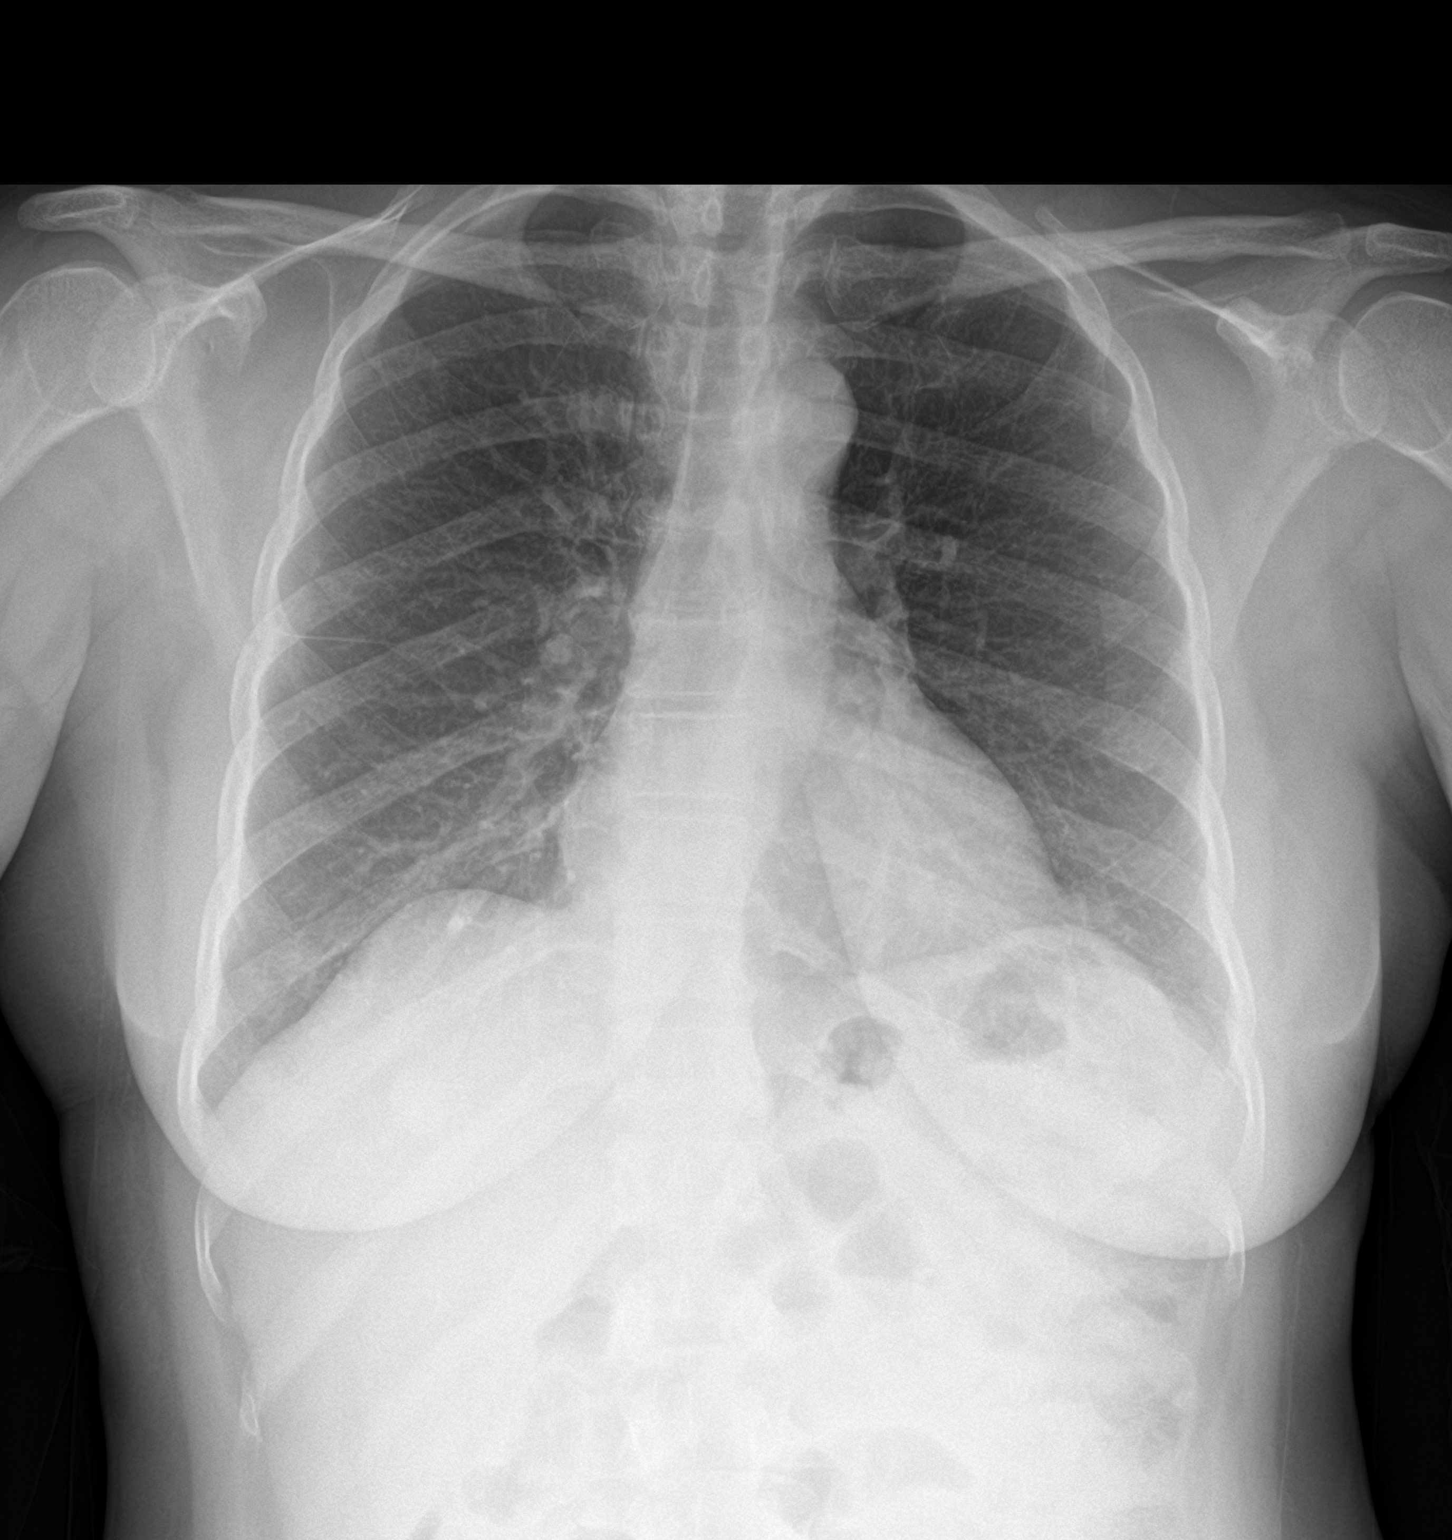

[chest lat]
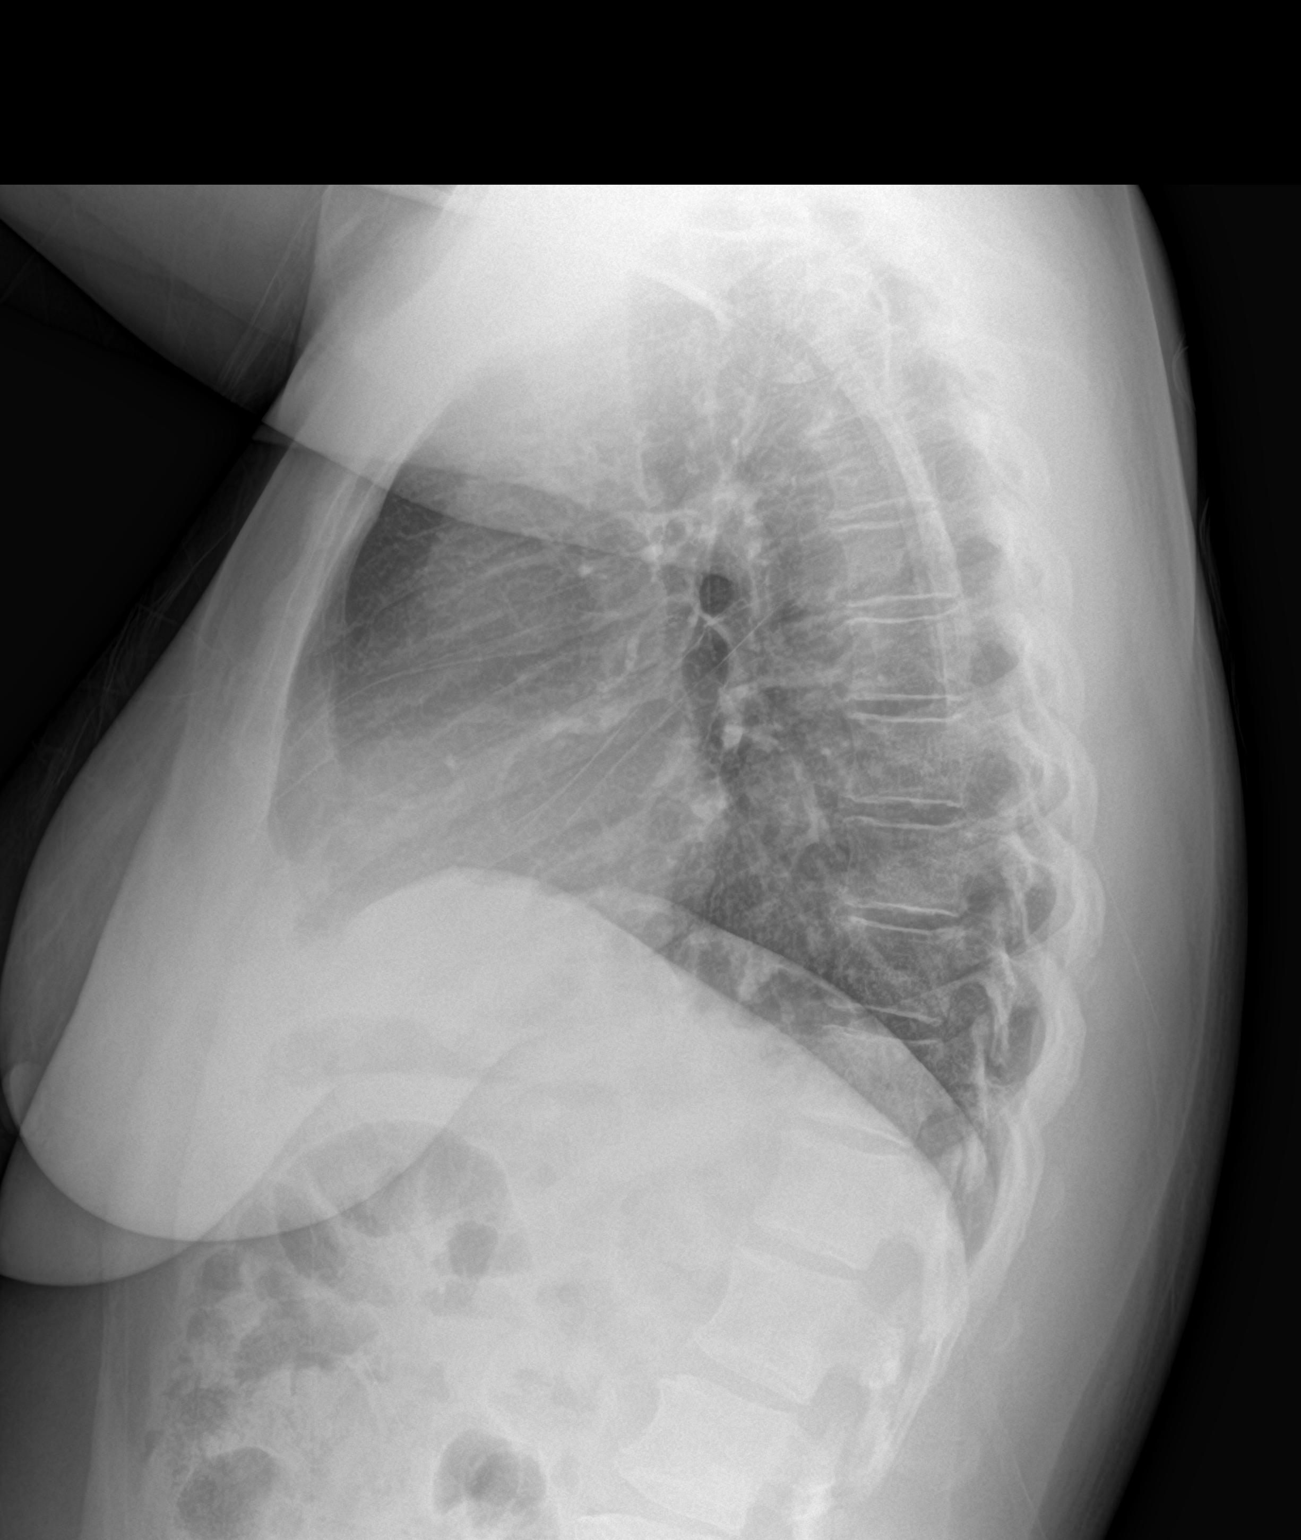

[2 of 2 positions shown; findings below may reference images not displayed]

FINDINGS: Lungs are clear. Heart size and pulmonary vascularity are normal. No
adenopathy. Trachea is midline. No bone lesions.
IMPRESSION: Lungs clear.  Cardiac silhouette normal.  Trachea midline.

## 2021-02-23 DIAGNOSIS — E890 Postprocedural hypothyroidism: Secondary | ICD-10-CM | POA: Diagnosis not present

## 2021-02-23 DIAGNOSIS — Z85850 Personal history of malignant neoplasm of thyroid: Secondary | ICD-10-CM | POA: Diagnosis not present

## 2021-03-06 DIAGNOSIS — Z85850 Personal history of malignant neoplasm of thyroid: Secondary | ICD-10-CM | POA: Diagnosis not present

## 2021-03-06 DIAGNOSIS — E890 Postprocedural hypothyroidism: Secondary | ICD-10-CM | POA: Diagnosis not present

## 2021-03-08 ENCOUNTER — Ambulatory Visit
Admission: RE | Admit: 2021-03-08 | Discharge: 2021-03-08 | Disposition: A | Discharge status: Home / Self Care | Payer: BC Managed Care – PPO | Source: Ambulatory Visit | Attending: Family Medicine | Admitting: Family Medicine

## 2021-03-08 ENCOUNTER — Other Ambulatory Visit: Payer: Self-pay | Admitting: Family Medicine

## 2021-03-08 DIAGNOSIS — Z1231 Encounter for screening mammogram for malignant neoplasm of breast: Secondary | ICD-10-CM

## 2021-04-02 ENCOUNTER — Encounter: Payer: Self-pay | Admitting: Family

## 2021-04-02 ENCOUNTER — Ambulatory Visit (INDEPENDENT_AMBULATORY_CARE_PROVIDER_SITE_OTHER): Payer: BC Managed Care – PPO | Admitting: Family

## 2021-04-02 ENCOUNTER — Other Ambulatory Visit (HOSPITAL_COMMUNITY)
Admission: RE | Admit: 2021-04-02 | Discharge: 2021-04-02 | Disposition: A | Discharge status: Home / Self Care | Payer: BC Managed Care – PPO | Source: Ambulatory Visit | Attending: Family | Admitting: Family

## 2021-04-02 ENCOUNTER — Other Ambulatory Visit: Payer: Self-pay

## 2021-04-02 VITALS — BP 106/74 | HR 83 | Temp 97.2°F | Ht 63.0 in | Wt 166.0 lb

## 2021-04-02 DIAGNOSIS — M545 Low back pain, unspecified: Secondary | ICD-10-CM | POA: Insufficient documentation

## 2021-04-02 DIAGNOSIS — R300 Dysuria: Secondary | ICD-10-CM | POA: Diagnosis not present

## 2021-04-02 DIAGNOSIS — D509 Iron deficiency anemia, unspecified: Secondary | ICD-10-CM

## 2021-04-02 DIAGNOSIS — N898 Other specified noninflammatory disorders of vagina: Secondary | ICD-10-CM | POA: Diagnosis not present

## 2021-04-02 DIAGNOSIS — R7303 Prediabetes: Secondary | ICD-10-CM

## 2021-04-02 LAB — POCT URINALYSIS DIP (MANUAL ENTRY)
Bilirubin, UA: NEGATIVE
Glucose, UA: NEGATIVE mg/dL
Ketones, POC UA: NEGATIVE mg/dL
Leukocytes, UA: NEGATIVE
Nitrite, UA: NEGATIVE
Protein Ur, POC: NEGATIVE mg/dL
Spec Grav, UA: 1.015 (ref 1.010–1.025)
Urobilinogen, UA: 0.2 E.U./dL
pH, UA: 7.5 (ref 5.0–8.0)

## 2021-04-02 LAB — COMPREHENSIVE METABOLIC PANEL
ALT: 23 U/L (ref 0–35)
AST: 18 U/L (ref 0–37)
Albumin: 4.1 g/dL (ref 3.5–5.2)
Alkaline Phosphatase: 73 U/L (ref 39–117)
BUN: 10 mg/dL (ref 6–23)
CO2: 29 mEq/L (ref 19–32)
Calcium: 9 mg/dL (ref 8.4–10.5)
Chloride: 103 mEq/L (ref 96–112)
Creatinine, Ser: 0.74 mg/dL (ref 0.40–1.20)
GFR: 92.64 mL/min (ref >60.00)
Glucose, Bld: 126 mg/dL — ABNORMAL HIGH (ref 70–99)
Potassium: 3.8 mEq/L (ref 3.5–5.1)
Sodium: 139 mEq/L (ref 135–145)
Total Bilirubin: 0.5 mg/dL (ref 0.2–1.2)
Total Protein: 7.5 g/dL (ref 6.0–8.3)

## 2021-04-02 LAB — URINALYSIS, MICROSCOPIC ONLY
RBC / HPF: NONE SEEN (ref 0–?)
WBC, UA: NONE SEEN (ref 0–?)

## 2021-04-02 LAB — CBC WITH DIFFERENTIAL/PLATELET
Basophils Absolute: 0 10*3/uL (ref 0.0–0.1)
Basophils Relative: 1 % (ref 0.0–3.0)
Eosinophils Absolute: 0.1 10*3/uL (ref 0.0–0.7)
Eosinophils Relative: 2.4 % (ref 0.0–5.0)
HCT: 36.1 % (ref 36.0–46.0)
Hemoglobin: 11.6 g/dL — ABNORMAL LOW (ref 12.0–15.0)
Lymphocytes Relative: 57.3 % — ABNORMAL HIGH (ref 12.0–46.0)
Lymphs Abs: 2.7 10*3/uL (ref 0.7–4.0)
MCHC: 32 g/dL (ref 30.0–36.0)
MCV: 87 fl (ref 78.0–100.0)
Monocytes Absolute: 0.3 10*3/uL (ref 0.1–1.0)
Monocytes Relative: 6.2 % (ref 3.0–12.0)
Neutro Abs: 1.6 10*3/uL (ref 1.4–7.7)
Neutrophils Relative %: 33.1 % — ABNORMAL LOW (ref 43.0–77.0)
Platelets: 262 10*3/uL (ref 150.0–400.0)
RBC: 4.14 Mil/uL (ref 3.87–5.11)
RDW: 14.3 % (ref 11.5–15.5)
WBC: 4.7 10*3/uL (ref 4.0–10.5)

## 2021-04-02 LAB — HEMOGLOBIN A1C: Hgb A1c MFr Bld: 6.4 % (ref 4.6–6.5)

## 2021-04-02 NOTE — Assessment & Plan Note (Signed)
Urine with culture ordered, pending results to pend treatment.

## 2021-04-02 NOTE — Addendum Note (Signed)
Addended by: Leeanne Rio on: 04/02/2021 02:29 PM   Modules accepted: Orders

## 2021-04-02 NOTE — Assessment & Plan Note (Addendum)
Suspected Bacterial vaginosis, Ancillary urine ordered, pending results.

## 2021-04-02 NOTE — Progress Notes (Signed)
Established Patient Office Visit  Subjective:  Patient ID: [name], female    DOB: [birth year]  Age: 51 y.o. MRN: [id number]  CC:  Chief Complaint  Patient presents with   Flank Pain    HPI [name] is here today with concerns.   Started with bil lower suprapubic tenderness around December 9th, 2022 and rotated to right hip, and now has since progressed to bil lower back, and has been intermittent pain since. She does still occasionally had dull aching intermittent pain in her suprapubic area, but pain more significant in lower back.she does note painful urination, and increased frequency. Does not notice blood in the urine. The pain in the lower back is worse with movement, and is sharp and stabbing. Pt states constant pain. No known injury or trauma to the back.   No fever and or chills.  Some mild white milky vaginal discharge on and off. Some vaginal smell/ fishy?   Intimate with her husband only, no known risk of Std.  IDA: more tired than normal, no longer with periods. No longer taking daily otc iron, states was told to stop as levels had improved. Increased chills as of lately, cold intolerance. No sb or chest pain.   Past Medical History:  Diagnosis Date   Allergy    Anemia    Hypertension    no meds hx of   Pre-diabetes    Thyroid cancer ([place])    Uterine fibroid     Past Surgical History:  Procedure Laterality Date   CESAREAN SECTION     x 3   THYROIDECTOMY N/A 06/07/2019   Procedure: TOTAL THYROIDECTOMY;  Surgeon: Kinsinger, Arta Bruce, MD;  Location: [facility];  Service: General;  Laterality: N/A;   TUBAL LIGATION      Family History  Problem Relation Age of Onset   Diabetes Mother    Hypertension Mother    Stroke Mother 10   Heart failure Father        s/p heart transplant   Leukemia Sister    Hypertension Brother    Pancreatic cancer Paternal Grandmother    Colon cancer Neg Hx    Rectal cancer Neg Hx    Stomach cancer Neg Hx      Social History   Socioeconomic History   Marital status: Married    Spouse name: Pilar Plate   Number of children: 2   Years of education: LPN and elementary education degree   Highest education level: Not on file  Occupational History   Not on file  Tobacco Use   Smoking status: Never   Smokeless tobacco: Never  Vaping Use   Vaping Use: Never used  Substance and Sexual Activity   Alcohol use: No   Drug use: No   Sexual activity: Yes    Birth control/protection: Post-menopausal  Other Topics Concern   Not on file  Social History Narrative   Lives with Pilar Plate   Step son - Merry Proud   2 children - Quita Skye   Also her mother lives in the home   Enjoys: traveling to [place] for vacation ([place]   Exercise: 7 minute intense exercise app that she does 2-3 times a week   Diet: avoids eating after 6 pm, avoids fruit due to prediabetes, eats veggies, meat   Social Determinants of Health   Financial Resource Strain: Not on file  Food Insecurity: Not on file  Transportation Needs: Not on file  Physical Activity: Not on file  Stress:  Not on file  Social Connections: Not on file  Intimate Partner Violence: Not on file    Outpatient Medications Prior to Visit  Medication Sig Dispense Refill   levothyroxine (SYNTHROID) 75 MCG tablet Take 75 mcg by mouth every morning.     levothyroxine (SYNTHROID) 88 MCG tablet Take 88 mcg by mouth daily.     No facility-administered medications prior to visit.    Allergies  Allergen Reactions   Vicodin [Hydrocodone-Acetaminophen] Shortness Of Breath    Patient states she is able to take regular tylenol    ROS Review of Systems  Constitutional:  Positive for chills. Negative for fever.  Respiratory:  Negative for shortness of breath.   Cardiovascular:  Negative for chest pain, palpitations and leg swelling.  Gastrointestinal:  Positive for abdominal pain (suprapubic) and constipation (chronic per pt). Negative for diarrhea, nausea and  vomiting.  Endocrine: Positive for cold intolerance.  Genitourinary:  Positive for dysuria, frequency, urgency and vaginal discharge (white milky discharge with fishy odor). Negative for difficulty urinating, flank pain, hematuria, pelvic pain, vaginal bleeding and vaginal pain.  Musculoskeletal:  Positive for back pain (lower back pain, constant, worse with movement). Negative for arthralgias.     Objective:    Physical Exam Constitutional:      General: She is not in acute distress.    Appearance: Normal appearance. She is obese. She is not ill-appearing, toxic-appearing or diaphoretic.  HENT:     Head: Normocephalic.  Cardiovascular:     Rate and Rhythm: Normal rate and regular rhythm.  Pulmonary:     Effort: Pulmonary effort is normal.     Breath sounds: Normal breath sounds.  Abdominal:     General: Abdomen is flat.     Tenderness: There is abdominal tenderness (bil lower suprapubic tenderness).  Musculoskeletal:     Lumbar back: No swelling or tenderness (no tenderness on palpation). Decreased range of motion (painful rom with external rotation to left and right side).  Skin:    General: Skin is warm.  Neurological:     Mental Status: She is alert.  Psychiatric:        Mood and Affect: Mood normal.        Behavior: Behavior normal.        Thought Content: Thought content normal.        Judgment: Judgment normal.    BP 106/74    Pulse 83    Temp (!) 97.2 F (36.2 C) (Temporal)    Ht 5\' 3"  (1.6 m)    Wt 166 lb (75.3 kg)    LMP 12/06/2014 (LMP Unknown)    SpO2 99%    BMI 29.41 kg/m  Wt Readings from Last 3 Encounters:  04/02/21 166 lb (75.3 kg)  05/11/20 166 lb 8 oz (75.5 kg)  12/13/19 169 lb 6.4 oz (76.8 kg)     Health Maintenance Due  Topic Date Due   Pneumococcal Vaccine 34-51 Years old (1 - PCV) Never done   Zoster Vaccines- Shingrix (1 of 2) Never done   COVID-19 Vaccine (3 - Pfizer risk series) 06/21/2019   PAP SMEAR-Modifier  08/09/2020   INFLUENZA VACCINE   10/16/2020    There are no preventive care reminders to display for this patient.  Lab Results  Component Value Date   TSH 0.60 05/11/2020   Lab Results  Component Value Date   WBC 4.7 05/11/2020   HGB 11.8 (L) 05/11/2020   HCT 35.7 (L) 05/11/2020   MCV 87.2 05/11/2020  PLT 165.0 05/11/2020   Lab Results  Component Value Date   NA 138 06/08/2019   K 4.2 06/08/2019   CO2 26 06/08/2019   GLUCOSE 117 (H) 06/08/2019   BUN 11 06/08/2019   CREATININE 0.76 06/08/2019   BILITOT 0.5 03/18/2019   ALKPHOS 75 10/09/2016   AST 14 03/18/2019   ALT 13 03/18/2019   PROT 7.4 03/18/2019   ALBUMIN 4.2 10/09/2016   CALCIUM 9.2 06/08/2019   ANIONGAP 7 06/08/2019   GFR 89.00 01/13/2019   Lab Results  Component Value Date   HGBA1C 6.1 05/11/2020      Assessment & Plan:   Problem List Items Addressed This Visit       Other   Anemia, iron deficiency - Primary    Cbc ordered today, pending      Relevant Orders   CBC w/Diff   Prediabetes    a1c ordered pending results. Work on diabetic diet exercise as tolerated.      Relevant Orders   Hemoglobin A1c   Comprehensive metabolic panel   Dysuria    Urine with culture ordered, pending results to pend treatment.       Relevant Orders   Urine Culture   POCT urinalysis dipstick   Urine Microscopic   Urine cytology ancillary only([place])   Comprehensive metabolic panel   Vaginal discharge    Suspected Bacterial vaginosis, Ancillary urine ordered, pending results.       Acute midline low back pain without sciatica    Suspected muscular, however pt with urinary symptoms and vaginal discharge need to r/o other etiologies prior to treating for muscular symptoms. Warm compresses to site prn pain, tylenol ok. No flank pain.         No orders of the defined types were placed in this encounter.   Follow-up: Return if symptoms worsen or fail to improve.    Eugenia Pancoast, FNP

## 2021-04-02 NOTE — Assessment & Plan Note (Signed)
a1c ordered pending results Work on diabetic diet exercise as tolerated 

## 2021-04-02 NOTE — Assessment & Plan Note (Addendum)
Suspected muscular, however pt with urinary symptoms and vaginal discharge need to r/o other etiologies prior to treating for muscular symptoms. Warm compresses to site prn pain, tylenol ok. No flank pain.

## 2021-04-02 NOTE — Assessment & Plan Note (Signed)
Cbc ordered today, pending

## 2021-04-02 NOTE — Patient Instructions (Signed)
Stop by the lab prior to leaving today. I will notify you of your results once received.   I am checking your lab work as well as a urine sample, based off of these I will determine how to treat your symptoms.   It was a pleasure seeing you today! Please do not hesitate to reach out with any questions and or concerns.  Regards,   Eugenia Pancoast FNP-C

## 2021-04-03 ENCOUNTER — Other Ambulatory Visit: Payer: Self-pay | Admitting: Family

## 2021-04-03 ENCOUNTER — Encounter: Payer: Self-pay | Admitting: Family

## 2021-04-03 DIAGNOSIS — N898 Other specified noninflammatory disorders of vagina: Secondary | ICD-10-CM

## 2021-04-03 LAB — URINE CYTOLOGY ANCILLARY ONLY
Bacterial Vaginitis-Urine: NEGATIVE
Candida Urine: NEGATIVE
Chlamydia: NEGATIVE
Comment: NEGATIVE
Comment: NORMAL
Neisseria Gonorrhea: NEGATIVE

## 2021-04-03 LAB — URINE CULTURE
MICRO NUMBER:: 12875425
SPECIMEN QUALITY:: ADEQUATE

## 2021-04-03 MED ORDER — METRONIDAZOLE 500 MG PO TABS: 500.0000 mg | ORAL_TABLET | Freq: Two times a day (BID) | ORAL | 0 refills | Status: AC

## 2021-04-03 MED ORDER — DARBEPOETIN ALFA 150 MCG/0.3ML IJ SOSY
PREFILLED_SYRINGE | INTRAMUSCULAR | Status: AC
  Filled 2021-04-03: qty 0.3

## 2021-04-10 ENCOUNTER — Telehealth: Payer: Self-pay | Admitting: Family Medicine

## 2021-04-10 NOTE — Telephone Encounter (Signed)
Pt called stating that she had an OV with Tabitha on 04/02/21. Pt stated that Tabitha prescribed her medication metroNIDAZOLE (FLAGYL) 500 MG tablet. Pt stated that medication is almost gone and pain is still the same, pt states that she cant even go to work. Pt would like to know what's going on. Please advise.

## 2021-04-12 ENCOUNTER — Encounter: Payer: Self-pay | Admitting: Family

## 2021-04-12 ENCOUNTER — Ambulatory Visit (INDEPENDENT_AMBULATORY_CARE_PROVIDER_SITE_OTHER): Payer: BC Managed Care – PPO

## 2021-04-12 ENCOUNTER — Other Ambulatory Visit: Payer: Self-pay

## 2021-04-12 ENCOUNTER — Ambulatory Visit (INDEPENDENT_AMBULATORY_CARE_PROVIDER_SITE_OTHER): Payer: BC Managed Care – PPO | Admitting: Family

## 2021-04-12 VITALS — BP 96/74 | HR 85 | Temp 96.7°F | Ht 63.0 in | Wt 167.0 lb

## 2021-04-12 DIAGNOSIS — M545 Low back pain, unspecified: Secondary | ICD-10-CM

## 2021-04-12 DIAGNOSIS — R1032 Left lower quadrant pain: Secondary | ICD-10-CM

## 2021-04-12 DIAGNOSIS — R7303 Prediabetes: Secondary | ICD-10-CM

## 2021-04-12 DIAGNOSIS — D72820 Lymphocytosis (symptomatic): Secondary | ICD-10-CM | POA: Diagnosis not present

## 2021-04-12 DIAGNOSIS — M62838 Other muscle spasm: Secondary | ICD-10-CM

## 2021-04-12 DIAGNOSIS — D509 Iron deficiency anemia, unspecified: Secondary | ICD-10-CM

## 2021-04-12 MED ORDER — METHOCARBAMOL 750 MG PO TABS: ORAL_TABLET | ORAL | 0 refills | Status: DC

## 2021-04-12 NOTE — Assessment & Plan Note (Signed)
Anemia is stable.

## 2021-04-12 NOTE — Patient Instructions (Signed)
Complete xray(s) prior to leaving today. I will notify you of your results once received.  Come back in the next 2 weeks for lab only appointment to repeat your CBC to see if the abnormalities have resolved.  As discussed in the office visit I do think that you are dealing with some constipation.  I want you to increase your oral intake of water as well as try MiraLAX once a night until you get some relief.  I also recommend a daily stool softener.  We are pending the results of the KUB of the abdomen to see if there is any stool burden.  For your lower back pain this may be a herniated disc and/or other etiology so we will order a lower back x-ray and pending his results to see what is the issue.  I do suggest warm compresses, you can use lidocaine patches over-the-counter, and I have sent a muscle relaxer to your pharmacy to see if any relief can be had.  We are pending the results of the x-ray to move forward.  It was a pleasure seeing you today! Please do not hesitate to reach out with any questions and or concerns.  Regards,   Eugenia Pancoast FNP-C

## 2021-04-12 NOTE — Assessment & Plan Note (Signed)
Repeat CBC ordered in the next 2 weeks.  Patient was recovering from the flu

## 2021-04-12 NOTE — Assessment & Plan Note (Signed)
Suspected constipation advised patient to start MiraLAX once daily to see if there is any relief.  Also advised to increase oral intake of water throughout the day.  Stool softener such as Colace also recommended.  If no improvement in the next 2 days please let me know.  I have also ordered a KUB of the abdomen pending the results,

## 2021-04-12 NOTE — Assessment & Plan Note (Signed)
Suspected muscle spasm of the lower back have prescribed Skelaxin the patient patient advised to take this at night when she is going to as this can cause excessive sleepiness.  Also can apply lidocaine patches and/or heat cold compresses for relief.

## 2021-04-12 NOTE — Assessment & Plan Note (Signed)
a1c increasing, pt instructed to Work on diabetic diet and exercise as tolerated.

## 2021-04-12 NOTE — Progress Notes (Signed)
Established Patient Office Visit  Subjective:  Patient ID: [name], female    DOB: 1971/02/25  Age: 51 y.o. MRN: [id number]  CC:  Chief Complaint  Patient presents with   Follow-up    HPI [name] is here today for follow up.   Last seen in office 04/02/21 with c/o bil lower back pain with intermittent pain since. Also with occasional  dull aching suprapubic pain. At the time was c/o dysuria and urinary frequency with some mild white milky vaginal discharge.   Poct urine, hematuria. Urine culture negative.  A1c increased from 6.1-6.4 Anemia stable per cbc however with low neutrophils and also elevated lymphocytes, wbc wnl Ancillary urine negative for g/c, BV, and candida.   Did take flagyl based off of symptoms and completed 7 days course with no improvement.   Today in office: states that she still has constant aching lower abdominal pain.   She has aching pain as well as her middle low back that is different from her lower back pain with sciatica. She does state worse with lying down and or trying to turn side to side while lying. Also worse with sitting, better with walking.   She does state she drinks 5 bottles of water a day. Doesn't drink coffee and or tea.   No longer with menses, went through menopause three years ago.   - elevated lymphocytes: pt does state she had the flu right before she was seen in the office.   Past Medical History:  Diagnosis Date   Allergy    Anemia    COVID-19 virus infection 04/27/2019   Hypertension    no meds hx of   Pre-diabetes    Thyroid cancer ([facility])    Thyroid cancer ([place]) 06/07/2019   Uterine fibroid     Past Surgical History:  Procedure Laterality Date   CESAREAN SECTION     x 3   THYROIDECTOMY N/A 06/07/2019   Procedure: TOTAL THYROIDECTOMY;  Surgeon: Kinsinger, Arta Bruce, MD;  Location: [facility];  Service: General;  Laterality: N/A;   TUBAL LIGATION      Family History  Problem Relation Age of Onset    Diabetes Mother    Hypertension Mother    Stroke Mother 81   Heart failure Father        s/p heart transplant   Leukemia Sister    Hypertension Brother    Pancreatic cancer Paternal Grandmother    Colon cancer Neg Hx    Rectal cancer Neg Hx    Stomach cancer Neg Hx     Social History   Socioeconomic History   Marital status: Married    Spouse name: Pilar Plate   Number of children: 2   Years of education: LPN and elementary education degree   Highest education level: Not on file  Occupational History   Not on file  Tobacco Use   Smoking status: Never   Smokeless tobacco: Never  Vaping Use   Vaping Use: Never used  Substance and Sexual Activity   Alcohol use: No   Drug use: No   Sexual activity: Yes    Birth control/protection: Post-menopausal  Other Topics Concern   Not on file  Social History Narrative   Lives with Pilar Plate   Step son - Merry Proud   2 children - Quita Skye   Also her mother lives in the home   Enjoys: traveling to [place] for vacation [place])   Exercise: 7 minute intense exercise app that she  does 2-3 times a week   Diet: avoids eating after 6 pm, avoids fruit due to prediabetes, eats veggies, meat   Social Determinants of Health   Financial Resource Strain: Not on file  Food Insecurity: Not on file  Transportation Needs: Not on file  Physical Activity: Not on file  Stress: Not on file  Social Connections: Not on file  Intimate Partner Violence: Not on file    Outpatient Medications Prior to Visit  Medication Sig Dispense Refill   levothyroxine (SYNTHROID) 75 MCG tablet Take 75 mcg by mouth every morning.     No facility-administered medications prior to visit.    Allergies  Allergen Reactions   Vicodin [Hydrocodone-Acetaminophen] Shortness Of Breath    Patient states she is able to take regular tylenol    ROS Review of Systems  Constitutional:  Negative for chills, fatigue, fever and unexpected weight change.  HENT:  Negative for congestion.    Respiratory:  Negative for cough, shortness of breath and wheezing.   Cardiovascular:  Negative for chest pain, palpitations and leg swelling.  Gastrointestinal:  Positive for abdominal pain (suprapubic) and constipation. Negative for abdominal distention, blood in stool, diarrhea, nausea and vomiting.  Genitourinary:  Positive for vaginal discharge (slight white milky discharge, fishy smell improved).  Musculoskeletal:  Positive for back pain (low back pain, worse with sitting or lying). Negative for myalgias.      Objective:    Physical Exam Constitutional:      General: She is not in acute distress.    Appearance: Normal appearance. She is obese. She is not ill-appearing, toxic-appearing or diaphoretic.  Cardiovascular:     Rate and Rhythm: Normal rate and regular rhythm.  Pulmonary:     Effort: Pulmonary effort is normal.     Breath sounds: Normal breath sounds.  Abdominal:     General: Abdomen is flat. Bowel sounds are decreased.     Palpations: Abdomen is soft.     Tenderness: There is abdominal tenderness in the left lower quadrant. There is no guarding or rebound. Negative signs include Murphy's sign and psoas sign.     Hernia: No hernia is present.  Musculoskeletal:        General: No swelling or tenderness.     Comments: No flank pain on percussion   Skin:    General: Skin is warm.  Neurological:     General: No focal deficit present.     Mental Status: She is alert and oriented to person, place, and time.  Psychiatric:        Mood and Affect: Mood normal.        Behavior: Behavior normal.        Thought Content: Thought content normal.        Judgment: Judgment normal.      BP 96/74    Pulse 85    Temp (!) 96.7 F (35.9 C) (Temporal)    Ht 5\' 3"  (1.6 m)    Wt 167 lb (75.8 kg)    LMP 12/06/2014 (LMP Unknown)    SpO2 98%    BMI 29.58 kg/m  Wt Readings from Last 3 Encounters:  04/12/21 167 lb (75.8 kg)  04/02/21 166 lb (75.3 kg)  05/11/20 166 lb 8 oz (75.5  kg)     Health Maintenance Due  Topic Date Due   Zoster Vaccines- Shingrix (1 of 2) Never done   COVID-19 Vaccine (3 - Pfizer risk series) 06/21/2019   PAP SMEAR-Modifier  08/09/2020  INFLUENZA VACCINE  10/16/2020    There are no preventive care reminders to display for this patient.  Lab Results  Component Value Date   TSH 0.60 05/11/2020   Lab Results  Component Value Date   WBC 4.7 04/02/2021   HGB 11.6 (L) 04/02/2021   HCT 36.1 04/02/2021   MCV 87.0 04/02/2021   PLT 262.0 04/02/2021   Lab Results  Component Value Date   NA 139 04/02/2021   K 3.8 04/02/2021   CO2 29 04/02/2021   GLUCOSE 126 (H) 04/02/2021   BUN 10 04/02/2021   CREATININE 0.74 04/02/2021   BILITOT 0.5 04/02/2021   ALKPHOS 73 04/02/2021   AST 18 04/02/2021   ALT 23 04/02/2021   PROT 7.5 04/02/2021   ALBUMIN 4.1 04/02/2021   CALCIUM 9.0 04/02/2021   ANIONGAP 7 06/08/2019   GFR 92.64 04/02/2021   Lab Results  Component Value Date   CHOL 150 12/06/2019   Lab Results  Component Value Date   HDL 58.50 12/06/2019   Lab Results  Component Value Date   LDLCALC 75 12/06/2019   Lab Results  Component Value Date   TRIG 80.0 12/06/2019   Lab Results  Component Value Date   CHOLHDL 3 12/06/2019   Lab Results  Component Value Date   HGBA1C 6.4 04/02/2021      Assessment & Plan:   Problem List Items Addressed This Visit       Other   Anemia, iron deficiency    Anemia is stable      Prediabetes    a1c increasing, pt instructed to Work on diabetic diet and exercise as tolerated.        Acute midline low back pain without sciatica    Lumbar x-ray ordered pending results also sent patient a prescription for Robaxin patient to take at night as this may cause sleepiness.  Patient to continue with Tylenol and/or ibuprofen as needed.  Lidocaine patches as needed and can also use heat and ice for conservative measures handout given to patient as well      Relevant Medications    methocarbamol (ROBAXIN) 750 MG tablet   Other Relevant Orders   DG Lumbar Spine Complete   Left lower quadrant abdominal pain    Suspected constipation advised patient to start MiraLAX once daily to see if there is any relief.  Also advised to increase oral intake of water throughout the day.  Stool softener such as Colace also recommended.  If no improvement in the next 2 days please let me know.  I have also ordered a KUB of the abdomen pending the results,       Relevant Orders   DG Abd 1 View   Lymphocytosis - Primary    Repeat CBC ordered in the next 2 weeks.  Patient was recovering from the flu      Relevant Orders   CBC w/Diff   Muscle spasm    Suspected muscle spasm of the lower back have prescribed Skelaxin the patient patient advised to take this at night when she is going to as this can cause excessive sleepiness.  Also can apply lidocaine patches and/or heat cold compresses for relief.      Relevant Medications   methocarbamol (ROBAXIN) 750 MG tablet    Meds ordered this encounter  Medications   methocarbamol (ROBAXIN) 750 MG tablet    Sig: Take one po qhs prn muscle spasm    Dispense:  20 tablet    Refill:  0    Order Specific Question:   Supervising Provider    Answer:   Diona Browner AMY E [[id number]    Follow-up: Return in about 2 weeks (around 04/26/2021), or if symptoms worsen or fail to improve, for lab only appointment , repeat labs (order in place).    Eugenia Pancoast, FNP

## 2021-04-12 NOTE — Assessment & Plan Note (Signed)
Lumbar x-ray ordered pending results also sent patient a prescription for Robaxin patient to take at night as this may cause sleepiness.  Patient to continue with Tylenol and/or ibuprofen as needed.  Lidocaine patches as needed and can also use heat and ice for conservative measures handout given to patient as well

## 2021-04-15 NOTE — Progress Notes (Signed)
No acute concerning findings in the xray of the abdomen, however a lot of stool as we suspected. Definitely constipated. If you haven't already I suggest you start miralax once daily as well as with a stool softener daily(such as colace). If no relief in the first few days then take some magnesium sulfate.

## 2021-04-15 NOTE — Progress Notes (Signed)
Your lower back zray does show a pinched nerve at L3/L4, makes sense for the pain you have been experiencing with some spurring as well. If pain continues I suggest you see neurosurgery and or orthospine. Do you want a referral?

## 2021-04-16 ENCOUNTER — Other Ambulatory Visit: Payer: Self-pay | Admitting: Family

## 2021-04-16 DIAGNOSIS — M47816 Spondylosis without myelopathy or radiculopathy, lumbar region: Secondary | ICD-10-CM

## 2021-04-25 ENCOUNTER — Encounter: Payer: Self-pay | Admitting: *Deleted

## 2021-04-30 ENCOUNTER — Other Ambulatory Visit: Payer: Self-pay

## 2021-04-30 ENCOUNTER — Other Ambulatory Visit (INDEPENDENT_AMBULATORY_CARE_PROVIDER_SITE_OTHER): Payer: BC Managed Care – PPO

## 2021-04-30 DIAGNOSIS — D72820 Lymphocytosis (symptomatic): Secondary | ICD-10-CM | POA: Diagnosis not present

## 2021-04-30 LAB — CBC WITH DIFFERENTIAL/PLATELET
Basophils Absolute: 0 10*3/uL (ref 0.0–0.1)
Basophils Relative: 0.8 % (ref 0.0–3.0)
Eosinophils Absolute: 0.2 10*3/uL (ref 0.0–0.7)
Eosinophils Relative: 4.1 % (ref 0.0–5.0)
HCT: 37.9 % (ref 36.0–46.0)
Hemoglobin: 12.2 g/dL (ref 12.0–15.0)
Lymphocytes Relative: 53.3 % — ABNORMAL HIGH (ref 12.0–46.0)
Lymphs Abs: 3 10*3/uL (ref 0.7–4.0)
MCHC: 32.1 g/dL (ref 30.0–36.0)
MCV: 87.9 fl (ref 78.0–100.0)
Monocytes Absolute: 0.5 10*3/uL (ref 0.1–1.0)
Monocytes Relative: 8.6 % (ref 3.0–12.0)
Neutro Abs: 1.9 10*3/uL (ref 1.4–7.7)
Neutrophils Relative %: 33.2 % — ABNORMAL LOW (ref 43.0–77.0)
Platelets: 204 10*3/uL (ref 150.0–400.0)
RBC: 4.31 Mil/uL (ref 3.87–5.11)
RDW: 15.1 % (ref 11.5–15.5)
WBC: 5.6 10*3/uL (ref 4.0–10.5)

## 2021-05-01 NOTE — Progress Notes (Signed)
Please call and let patient know that with her repeat [place] her anemia is stable and a bit improved.  I again did find the low neutrophils and elevated lymphocytes but they are very mild in nature.  I suggest when she follows up for her regular appointment with Dr. Einar Pheasant that she has these repeated if Dr. Einar Pheasant feels this is necessary.  I will also copy Dr. Einar Pheasant on this so she is aware. (It does not see my chart messages)

## 2021-05-01 NOTE — Progress Notes (Signed)
Spoke to pt and agreed to follow up with Dr. Einar Pheasant.

## 2021-05-01 NOTE — Progress Notes (Signed)
If patient is not willing to let us know what she would like addressed over the phone call please let her know to follow-up with Dr. Einar Pheasant for ongoing evaluation.  I have also updated Dr. Einar Pheasant on this matter.

## 2021-05-07 DIAGNOSIS — Z85850 Personal history of malignant neoplasm of thyroid: Secondary | ICD-10-CM | POA: Diagnosis not present

## 2021-05-07 DIAGNOSIS — E890 Postprocedural hypothyroidism: Secondary | ICD-10-CM | POA: Diagnosis not present

## 2021-05-24 ENCOUNTER — Ambulatory Visit (INDEPENDENT_AMBULATORY_CARE_PROVIDER_SITE_OTHER): Payer: BC Managed Care – PPO | Admitting: Family Medicine

## 2021-05-24 ENCOUNTER — Other Ambulatory Visit: Payer: Self-pay

## 2021-05-24 VITALS — BP 110/80 | HR 74 | Temp 98.6°F | Resp 14 | Ht 63.0 in | Wt 170.4 lb

## 2021-05-24 DIAGNOSIS — J302 Other seasonal allergic rhinitis: Secondary | ICD-10-CM

## 2021-05-24 DIAGNOSIS — D72820 Lymphocytosis (symptomatic): Secondary | ICD-10-CM | POA: Diagnosis not present

## 2021-05-24 DIAGNOSIS — R0989 Other specified symptoms and signs involving the circulatory and respiratory systems: Secondary | ICD-10-CM | POA: Diagnosis not present

## 2021-05-24 MED ORDER — FLUTICASONE PROPIONATE 50 MCG/ACT NA SUSP: 2.0000 | Freq: Every day | NASAL | 0 refills | Status: DC

## 2021-05-24 NOTE — Assessment & Plan Note (Signed)
Previously seen in 2021 with resolution of similar symptoms.  Current symptoms associated with congestion suspect possible allergy driver.  Trial of allergy medication including Flonase, if not improving will refer to ear nose and throat. ?

## 2021-05-24 NOTE — Progress Notes (Signed)
? ?Subjective:  ? ?  ?[name] is a 51 y.o. female presenting for Acute Visit (Pt arrives to clinic stating that she feel something stuck in her throat, neck heaviness onset 1 wk ago, she states she had thyroid surgery and felt good for awhile and then the same feeling has returned. Able to eat,drink w/o any issues. ) ?  ? ? ?HPI ? ?#Throat heaviness ?- started last week ?- sinus drainage ?- feels heavy on the neck ?- no lumps or swollen lymph nodes ?- no cough ?- no reflux or heartburn ?- no n/v/diarrhea ?- no muscle aches ?- no ear pain ?- no face pain/pressure ?- swallowing OK  ?- eating if fine ?- breathing is fine ?- went to her thyroid doctor and had synthroid adjusted and some labs done due to nightsweats - this was 1 month ago ?- nightsweats have improved ? ? ?Review of Systems ? ? ?Social History  ? ?Tobacco Use  ?Smoking Status Never  ?Smokeless Tobacco Never  ? ? ? ?   ?Objective:  ?  ?BP Readings from Last 3 Encounters:  ?05/24/21 110/80  ?04/12/21 96/74  ?04/02/21 106/74  ? ?Wt Readings from Last 3 Encounters:  ?05/24/21 170 lb 6.4 oz (77.3 kg)  ?04/12/21 167 lb (75.8 kg)  ?04/02/21 166 lb (75.3 kg)  ? ? ?BP 110/80 (BP Location: Left Arm, Patient Position: Sitting, Cuff Size: Normal)   Pulse 74   Temp 98.6 ?F (37 ?C) (Oral)   Resp 14   Ht '5\' 3"'$  (1.6 m)   Wt 170 lb 6.4 oz (77.3 kg)   LMP 12/06/2014 (LMP Unknown)   SpO2 100%   BMI 30.19 kg/m?  ? ? ?Physical Exam ?Constitutional:   ?   General: She is not in acute distress. ?   Appearance: She is well-developed. She is not diaphoretic.  ?HENT:  ?   Head: Normocephalic and atraumatic.  ?   Right Ear: Tympanic membrane and ear canal normal.  ?   Left Ear: Tympanic membrane and ear canal normal.  ?   Nose: Mucosal edema and rhinorrhea present.  ?   Right Sinus: No maxillary sinus tenderness or frontal sinus tenderness.  ?   Left Sinus: No maxillary sinus tenderness or frontal sinus tenderness.  ?   Mouth/Throat:  ?   Pharynx: Uvula midline.  Posterior oropharyngeal erythema present. No oropharyngeal exudate.  ?   Tonsils: 1+ on the right. 1+ on the left.  ?Eyes:  ?   General: No scleral icterus. ?   Conjunctiva/sclera: Conjunctivae normal.  ?Neck:  ?   Thyroid: No thyroid mass, thyromegaly or thyroid tenderness.  ?Cardiovascular:  ?   Rate and Rhythm: Normal rate and regular rhythm.  ?   Heart sounds: Normal heart sounds. No murmur heard. ?Pulmonary:  ?   Effort: Pulmonary effort is normal. No respiratory distress.  ?   Breath sounds: Normal breath sounds.  ?Musculoskeletal:  ?   Cervical back: Neck supple.  ?Lymphadenopathy:  ?   Cervical: No cervical adenopathy.  ?Skin: ?   General: Skin is warm and dry.  ?   Capillary Refill: Capillary refill takes less than 2 seconds.  ?Neurological:  ?   Mental Status: She is alert.  ? ? ? ? ? ?   ?Assessment & Plan:  ? ?Problem List Items Addressed This Visit   ? ?  ? Other  ? Globus sensation - Primary  ?  Previously seen in 2021 with resolution of similar  symptoms.  Current symptoms associated with congestion suspect possible allergy driver.  Trial of allergy medication including Flonase, if not improving will refer to ear nose and throat. ?  ?  ? Relevant Medications  ? fluticasone (FLONASE) 50 MCG/ACT nasal spray  ? Lymphocytosis  ?  Recently diagnosed with abnormal white blood cells including differentiation.  Discussed E consult to hematology to see if any additional work-up would be warranted.  Of note she was at that time having some night sweats, she did see her endocrinologist and had her thyroid medication adjusted with resolution of symptoms.  Otherwise apart from globus sensation she notes feeling fine. ?  ?  ? Seasonal allergies  ? Relevant Medications  ? fluticasone (FLONASE) 50 MCG/ACT nasal spray  ? ?I spent >25 minutes with pt , obtaining history, examining, reviewing chart, documenting encounter and discussing the above plan of care. ? ? ?Return if symptoms worsen or fail to improve. ? ?Lesleigh Noe, MD ? ?This visit occurred during the SARS-CoV-2 public health emergency.  Safety protocols were in place, including screening questions prior to the visit, additional usage of staff PPE, and extensive cleaning of exam room while observing appropriate contact time as indicated for disinfecting solutions.  ? ?

## 2021-05-24 NOTE — Assessment & Plan Note (Signed)
Recently diagnosed with abnormal white blood cells including differentiation.  Discussed E consult to hematology to see if any additional work-up would be warranted.  Of note she was at that time having some night sweats, she did see her endocrinologist and had her thyroid medication adjusted with resolution of symptoms.  Otherwise apart from globus sensation she notes feeling fine. ?

## 2021-05-24 NOTE — Patient Instructions (Addendum)
#  Throat heaviness ?- start Flonase nasal spray ?- start any 24 hour allergy medication ?- update in 1 week if not improved and can consider ENT referral ?- mychart is fine ? ?#abnormal blood counts ?- I will do e-consult to see if we should do anything different for your blood counts ?- let me know if any new symptoms - fevers, chills, night sweats  ?

## 2021-05-25 ENCOUNTER — Encounter: Payer: Self-pay | Admitting: Family Medicine

## 2021-06-08 ENCOUNTER — Other Ambulatory Visit: Payer: Self-pay | Admitting: Family Medicine

## 2021-06-08 DIAGNOSIS — J302 Other seasonal allergic rhinitis: Secondary | ICD-10-CM

## 2021-06-08 DIAGNOSIS — R0989 Other specified symptoms and signs involving the circulatory and respiratory systems: Secondary | ICD-10-CM

## 2021-07-09 ENCOUNTER — Ambulatory Visit (INDEPENDENT_AMBULATORY_CARE_PROVIDER_SITE_OTHER): Payer: BC Managed Care – PPO | Admitting: Family Medicine

## 2021-07-09 VITALS — BP 100/68 | HR 74 | Temp 97.9°F | Wt 175.5 lb

## 2021-07-09 DIAGNOSIS — M25471 Effusion, right ankle: Secondary | ICD-10-CM | POA: Insufficient documentation

## 2021-07-09 DIAGNOSIS — Z85850 Personal history of malignant neoplasm of thyroid: Secondary | ICD-10-CM | POA: Insufficient documentation

## 2021-07-09 DIAGNOSIS — Z9089 Acquired absence of other organs: Secondary | ICD-10-CM | POA: Insufficient documentation

## 2021-07-09 DIAGNOSIS — R0989 Other specified symptoms and signs involving the circulatory and respiratory systems: Secondary | ICD-10-CM

## 2021-07-09 DIAGNOSIS — E890 Postprocedural hypothyroidism: Secondary | ICD-10-CM | POA: Insufficient documentation

## 2021-07-09 NOTE — Assessment & Plan Note (Signed)
Improving per patient, suspect she may have some mild sprain that is already starting to recover.  Encouraged to continue to watch and wait and update if worsening. ?

## 2021-07-09 NOTE — Assessment & Plan Note (Signed)
Appreciate Dr. Buddy Duty support.  Continue levothyroxine 75 mcg. ?

## 2021-07-09 NOTE — Progress Notes (Signed)
? ?Subjective:  ? ?  ?[name] is a 51 y.o. female presenting for Referral (ENT. Location not important. The sooner the better. ) and Edema (R ankle x 1 week) ?  ? ? ?HPI ? ?#Globus sensation ?- no improvement w/ allergy treatment ?- feels internal ?- was using reflux medication in the past ?- using apple cider vinegar  ? ? ?#Edema ?- right side ?- swelling on the ankle ?- when walking can feel like there is fluid ?- couldn't sleep last week due to pain in the ankle ?- no injury ?- just walking normal - long hours of standing ?- no hx of injuries to that foot ?- last time this happened when she traveled to [place] and had swelling with layover ?- wears compression socks ?- no change throughout the day ? ?#thyroid ?- follows with dr. Wylene Simmer ?- recently decreased thyroid to 75 mcg ?- was getting nightsweats ?- those have improved some ?- anticipating labs in 4 months ? ? ?Review of Systems ? ? ?Social History  ? ?Tobacco Use  ?Smoking Status Never  ?Smokeless Tobacco Never  ? ? ? ?   ?Objective:  ?  ?BP Readings from Last 3 Encounters:  ?07/09/21 100/68  ?05/24/21 110/80  ?04/12/21 96/74  ? ?Wt Readings from Last 3 Encounters:  ?07/09/21 175 lb 8 oz (79.6 kg)  ?05/24/21 170 lb 6.4 oz (77.3 kg)  ?04/12/21 167 lb (75.8 kg)  ? ? ?BP 100/68   Pulse 74   Temp 97.9 ?F (36.6 ?C) (Oral)   Wt 175 lb 8 oz (79.6 kg)   LMP 12/06/2014 (LMP Unknown)   SpO2 99%   BMI 31.09 kg/m?  ? ? ?Physical Exam ?Constitutional:   ?   General: She is not in acute distress. ?   Appearance: She is well-developed. She is not diaphoretic.  ?HENT:  ?   Right Ear: External ear normal.  ?   Left Ear: External ear normal.  ?   Nose: Nose normal.  ?Eyes:  ?   Conjunctiva/sclera: Conjunctivae normal.  ?Cardiovascular:  ?   Rate and Rhythm: Normal rate.  ?Pulmonary:  ?   Effort: Pulmonary effort is normal.  ?Musculoskeletal:  ?   Cervical back: Normal range of motion and neck supple. No rigidity. No muscular tenderness.  ?   Comments: Right  leg ?Inspection: mild swelling above the medal malleolus ?Palpation: no ttp ?ROM: normal ?Strength: Normal  ?Lymphadenopathy:  ?   Cervical: No cervical adenopathy.  ?Skin: ?   General: Skin is warm and dry.  ?   Capillary Refill: Capillary refill takes less than 2 seconds.  ?Neurological:  ?   Mental Status: She is alert. Mental status is at baseline.  ?Psychiatric:     ?   Mood and Affect: Mood normal.     ?   Behavior: Behavior normal.  ? ? ? ? ? ?   ?Assessment & Plan:  ? ?Problem List Items Addressed This Visit   ? ?  ? Endocrine  ? Postoperative hypothyroidism  ?  Appreciate Dr. Buddy Duty support.  Continue levothyroxine 75 mcg. ? ?  ?  ?  ? Other  ? Globus sensation - Primary  ?  Discussed trial of omeprazole, but also will place referral to ear nose and throat.  Consider CT scan if ENT referral is going to be a couple of months out no improvement with omeprazole.  Failed allergy management.  Exam once again without any masses noted. ? ?  ?  ?  Relevant Orders  ? Ambulatory referral to ENT  ? Right ankle swelling  ?  Improving per patient, suspect she may have some mild sprain that is already starting to recover.  Encouraged to continue to watch and wait and update if worsening. ? ?  ?  ? ? ? ?Return if symptoms worsen or fail to improve. ? ?Lesleigh Noe, MD ? ? ? ?

## 2021-07-09 NOTE — Patient Instructions (Addendum)
Ankle pain ?- likely a mild sprain ?- if not continuing improve update ?- continue compression ? ?#Referral ?I have placed a referral to a specialist for you. You should receive a phone call from the specialty office. Make sure your voicemail is not full and that if you are able to answer your phone to unknown or new numbers.  ? ?It may take up to 2 weeks to hear about the referral. If you do not hear anything in 2 weeks, please call our office and ask to speak with the referral coordinator.  ? ? ?Call or mychart if ENT appointment far out and you want to get imaging - would likely do CT Neck ? ?Omperazole 20 mg -- over the counter ?- 1-2 weeks  ?- stop if no change, continue if improving ?

## 2021-07-09 NOTE — Assessment & Plan Note (Signed)
Discussed trial of omeprazole, but also will place referral to ear nose and throat.  Consider CT scan if ENT referral is going to be a couple of months out no improvement with omeprazole.  Failed allergy management.  Exam once again without any masses noted. ?

## 2021-07-18 ENCOUNTER — Encounter: Payer: Self-pay | Admitting: *Deleted

## 2022-01-21 ENCOUNTER — Encounter: Payer: Self-pay | Admitting: Family Medicine

## 2022-01-21 ENCOUNTER — Ambulatory Visit (INDEPENDENT_AMBULATORY_CARE_PROVIDER_SITE_OTHER): Payer: BC Managed Care – PPO | Admitting: Family Medicine

## 2022-01-21 VITALS — BP 132/78 | HR 45 | Temp 97.9°F | Ht 63.0 in | Wt 181.1 lb

## 2022-01-21 DIAGNOSIS — Z7184 Encounter for health counseling related to travel: Secondary | ICD-10-CM

## 2022-01-21 DIAGNOSIS — R7303 Prediabetes: Secondary | ICD-10-CM | POA: Diagnosis not present

## 2022-01-21 LAB — POCT GLYCOSYLATED HEMOGLOBIN (HGB A1C): Hemoglobin A1C: 6.2 % — AB (ref 4.0–5.6)

## 2022-01-21 MED ORDER — ATOVAQUONE-PROGUANIL HCL 250-100 MG PO TABS: 1.0000 | ORAL_TABLET | Freq: Every day | ORAL | 0 refills | Status: DC

## 2022-01-21 NOTE — Assessment & Plan Note (Signed)
Malarone sent for malaria proph for upcoming travel to [place]

## 2022-01-21 NOTE — Progress Notes (Signed)
Subjective:    Patient ID: [name], female    DOB: 07-11-1970, 51 y.o.   MRN: [id number]  HPI 51 yo pf of Dr Einar Pheasant presents for f/u of labs for prediabetes  Wt Readings from Last 3 Encounters:  01/21/22 181 lb 2 oz (82.2 kg)  07/09/21 175 lb 8 oz (79.6 kg)  05/24/21 170 lb 6.4 oz (77.3 kg)   32.08 kg/m   Prediabetes Lab Results  Component Value Date   HGBA1C 6.4 04/02/2021   This was up   She has not done anything since then unfortunately  Had a trip to [place] and ate poorly   She had gestational dm in past   A little better today 6.2    H/o  dm in mother  Also siblings   Plans to start watching diet  She thinks green vegetables affect her thyroid  Does not eat high sugar foods  No bread if she does, eats 1/2 as much)    She plans to start exercise  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 sleep and then too tired to exercise   Wants to start walking   Needs generic malarone for upcoming travel to [place] next mo for 3 months  For malaria prohpylaxis  Patient Active Problem List   Diagnosis Date Noted   Right ankle swelling 07/09/2021   History of papillary adenocarcinoma of thyroid 07/09/2021   History of total thyroidectomy 07/09/2021   Postoperative hypothyroidism 07/09/2021   Seasonal allergies 05/24/2021   Left lower quadrant abdominal pain 04/12/2021   Lymphocytosis 04/12/2021   Muscle spasm 04/12/2021   Acute midline low back pain without sciatica 04/02/2021   Recurrent infection of skin 05/11/2020   Chronic nonintractable headache 12/06/2019   Vaginal lump 11/01/2019   Mass of left chest wall 11/01/2019   Prolapsed internal hemorrhoids 06/28/2019   Globus sensation 06/28/2019   Murmur 03/18/2019   Fatigue of lower extremity 03/18/2019   Prediabetes 01/13/2019   Menopausal vaginal dryness 01/13/2019   Acne vulgaris 03/04/2018   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   Skin tag 10/09/2016    Counseling for travel 08/11/2015   HYPERTENSION, BENIGN ESSENTIAL 01/07/2007   Anemia, iron deficiency 05/15/2006   Past Medical History:  Diagnosis Date   Allergy    Anemia    COVID-19 virus infection 04/27/2019   Hypertension    no meds hx of   Pre-diabetes    Thyroid cancer ([place])    Thyroid cancer ([place]) 06/07/2019   Uterine fibroid    Past Surgical History:  Procedure Laterality Date   CESAREAN SECTION     x 3   THYROIDECTOMY N/A 06/07/2019   Procedure: TOTAL THYROIDECTOMY;  Surgeon: Mickeal Skinner, MD;  Location: [facility];  Service: General;  Laterality: N/A;   TUBAL LIGATION     Social History   Tobacco Use   Smoking status: Never   Smokeless tobacco: Never  Vaping Use   Vaping Use: Never used  Substance Use Topics   Alcohol use: No   Drug use: No   Family History  Problem Relation Age of Onset   Diabetes Mother    Hypertension Mother    Stroke Mother 22   Heart failure Father        s/p heart transplant   Leukemia Sister    Hypertension Brother    Pancreatic cancer Paternal Grandmother    Colon cancer Neg Hx  Rectal cancer Neg Hx    Stomach cancer Neg Hx    Allergies  Allergen Reactions   Vicodin [Hydrocodone-Acetaminophen] Shortness Of Breath    Patient states she is able to take regular tylenol   Current Outpatient Medications on File Prior to Visit  Medication Sig Dispense Refill   levothyroxine (SYNTHROID) 75 MCG tablet Take 75 mcg by mouth every morning.     No current facility-administered medications on file prior to visit.    Review of Systems  Constitutional:  Positive for fatigue. Negative for activity change, appetite change, fever and unexpected weight change.  HENT:  Negative for congestion, ear pain, rhinorrhea, sinus pressure and sore throat.   Eyes:  Negative for pain, redness and visual disturbance.  Respiratory:  Negative for cough, shortness of breath and wheezing.   Cardiovascular:  Negative for chest pain and palpitations.   Gastrointestinal:  Negative for abdominal pain, blood in stool, constipation and diarrhea.  Endocrine: Negative for polydipsia, polyphagia and polyuria.  Genitourinary:  Negative for dysuria, frequency and urgency.  Musculoskeletal:  Negative for arthralgias, back pain and myalgias.  Skin:  Negative for pallor and rash.  Allergic/Immunologic: Negative for environmental allergies.  Neurological:  Negative for dizziness, syncope and headaches.  Hematological:  Negative for adenopathy. Does not bruise/bleed easily.  Psychiatric/Behavioral:  Negative for decreased concentration and dysphoric mood. The patient is not nervous/anxious.        Objective:   Physical Exam Constitutional:      General: She is not in acute distress.    Appearance: Normal appearance. She is well-developed. She is obese. She is not ill-appearing or diaphoretic.  HENT:     Head: Normocephalic and atraumatic.  Eyes:     Conjunctiva/sclera: Conjunctivae normal.     Pupils: Pupils are equal, round, and reactive to light.  Neck:     Thyroid: No thyromegaly.     Vascular: No carotid bruit or JVD.  Cardiovascular:     Rate and Rhythm: Normal rate and regular rhythm.     Heart sounds: Normal heart sounds.     No gallop.  Pulmonary:     Effort: Pulmonary effort is normal. No respiratory distress.     Breath sounds: Normal breath sounds. No wheezing or rales.  Abdominal:     General: There is no distension or abdominal bruit.     Palpations: Abdomen is soft.  Musculoskeletal:     Cervical back: Normal range of motion and neck supple.     Right lower leg: No edema.     Left lower leg: No edema.  Lymphadenopathy:     Cervical: No cervical adenopathy.  Skin:    General: Skin is warm and dry.     Coloration: Skin is not pale.     Findings: No rash.  Neurological:     Mental Status: She is alert.     Sensory: No sensory deficit.     Coordination: Coordination normal.     Deep Tendon Reflexes: Reflexes are  normal and symmetric. Reflexes normal.  Psychiatric:        Mood and Affect: Mood normal.           Assessment & Plan:   Problem List Items Addressed This Visit       Other   Counseling for travel    Malarone sent for malaria proph for upcoming travel to [place]      Prediabetes - Primary    Lab Results  Component Value Date   HGBA1C  6.2 (A) 01/21/2022  Disc lifestyle change to prevent DM2 Her mother and sibs have it  disc imp of low glycemic diet and wt loss to prevent DM2  She is motivated to work on this  Handouts given Disc exercise (cardio and strength training) to start when able to schedule it        Relevant Orders   POCT glycosylated hemoglobin (Hb A1C) (Completed)

## 2022-01-21 NOTE — Assessment & Plan Note (Signed)
Lab Results  Component Value Date   HGBA1C 6.2 (A) 01/21/2022   Disc lifestyle change to prevent DM2 Her mother and sibs have it  disc imp of low glycemic diet and wt loss to prevent DM2  She is motivated to work on this  Handouts given Disc exercise (cardio and strength training) to start when able to schedule it

## 2022-01-21 NOTE — Patient Instructions (Addendum)
To prevent diabetes  Try to get most of your carbohydrates from produce (with the exception of white potatoes)  Eat less bread/pasta/rice/snack foods/cereals/sweets and other items from the middle of the grocery store (processed carbs)  When you do start exercise  Walk  Then add some strength building  Exercise bands, light weight Internet program  Gym and trainer are great also   We can check you A1c in another 6 to 12 months

## 2022-01-30 ENCOUNTER — Telehealth: Payer: Self-pay

## 2022-01-30 MED ORDER — MEFLOQUINE HCL 250 MG PO TABS: 250.0000 mg | ORAL_TABLET | ORAL | 0 refills | Status: DC

## 2022-01-30 NOTE — Addendum Note (Signed)
Addended by: Loura Pardon A on: 01/30/2022 09:36 AM   Modules accepted: Orders

## 2022-01-30 NOTE — Telephone Encounter (Signed)
Patient notified as instructed by telephone and verbalized understanding. 

## 2022-01-30 NOTE — Telephone Encounter (Signed)
[place] Night - Client Nonclinical Telephone Record  [facility]urse Client [facility] Night - Client Client Site [place] - Night Provider AA - PHYSICIAN, Verita Schneiders- MD Contact Type Call Who Is Calling Patient / Member / Family / Caregiver Caller Name [place] Phone Number [phone] Patient Name [name] Patient DOB Feb 17, 1971 Call Type Message Only Information Provided Reason for Call Request for General Office Information Initial Comment Caller had a visit last week and she asked the doctor to get her some malaria medication. The one she got her was too expensive. She likes the once a week one. Disp. Time Disposition Final User 01/29/2022 5:05:05 PM General Information Provided Yes Abigail Butts Call Closed By: Abigail Butts Transaction Date/Time: 01/29/2022 5:00:46 PM (ET

## 2022-01-30 NOTE — Telephone Encounter (Signed)
I sent the mefloquine  Let me know if there are any problems with this

## 2022-02-04 ENCOUNTER — Encounter: Payer: BC Managed Care – PPO | Admitting: Internal Medicine

## 2022-03-04 DIAGNOSIS — E890 Postprocedural hypothyroidism: Secondary | ICD-10-CM | POA: Diagnosis not present

## 2022-03-04 DIAGNOSIS — Z85850 Personal history of malignant neoplasm of thyroid: Secondary | ICD-10-CM | POA: Diagnosis not present

## 2022-03-13 ENCOUNTER — Encounter: Payer: Self-pay | Admitting: Family Medicine

## 2022-03-13 ENCOUNTER — Ambulatory Visit (INDEPENDENT_AMBULATORY_CARE_PROVIDER_SITE_OTHER): Payer: BC Managed Care – PPO | Admitting: Family Medicine

## 2022-03-13 VITALS — BP 100/70 | HR 80 | Temp 98.8°F | Ht 63.0 in | Wt 178.4 lb

## 2022-03-13 DIAGNOSIS — H9313 Tinnitus, bilateral: Secondary | ICD-10-CM

## 2022-03-13 NOTE — Progress Notes (Signed)
    [name] T. Tammie Ladouceur, MD, [place] at [facility] [place], [address]  Phone: [phone]  FAX: [place] - 51 y.o. female  MRN [id number]  Date of Birth: December 22, 1970  Date: 03/13/2022  PCP: Waunita Schooner, MD  Referral: Waunita Schooner, MD  Chief Complaint  Patient presents with   Noise in Ears   Subjective:   [name] is a 51 y.o. very pleasant female patient with Body mass index is 31.6 kg/m. who presents with the following:  Patient presents with a buzzing sound in her ears.  Has been going on for a while, and first thought that it might be in her room.    There is a scratchy thing in her ears.    Has heard about tinnitus.  Has been going on for months.   Did have a cold around October.  Was there even before then.   No concerts or other loud sounds.    Review of Systems is noted in the HPI, as appropriate  Objective:   BP 100/70   Pulse 80   Temp 98.8 F (37.1 C) (Oral)   Ht '5\' 3"'$  (1.6 m)   Wt 178 lb 6 oz (80.9 kg)   LMP 12/06/2014 (LMP Unknown)   SpO2 99%   BMI 31.60 kg/m   GEN: No acute distress; alert,appropriate. PULM: Breathing comfortably in no respiratory distress PSYCH: Normally interactive.  TMs are clear without any fluid.  All anatomy is easily visualized.  Laboratory and Imaging Data:  Assessment and Plan:     ICD-10-CM   1. Tinnitus of both ears  H93.13      Classic.  This is a challenging condition, there really minimal to no treatments at work.  It can last for varying amounts of time sometimes for months and sometimes last longer and is often lifelong.  We reviewed all of this in full, and also reviewed and up-to-date article with her.  [employer] speech-to-text software was used for transcription in this dictation.  Possible transcriptional errors can occur using Editor, commissioning.   Signed,  Maud Deed. Tifany Hirsch, MD   Outpatient  Encounter Medications as of 03/13/2022  Medication Sig   levothyroxine (SYNTHROID) 75 MCG tablet Take 75 mcg by mouth every morning.   [DISCONTINUED] mefloquine (LARIAM) 250 MG tablet Take 1 tablet (250 mg total) by mouth every 7 (seven) days. 1-2 weeks prior to travel and for 4 weeks after   No facility-administered encounter medications on file as of 03/13/2022.

## 2022-03-28 ENCOUNTER — Encounter (HOSPITAL_COMMUNITY): Payer: Self-pay

## 2022-03-28 ENCOUNTER — Other Ambulatory Visit: Payer: Self-pay

## 2022-03-28 ENCOUNTER — Telehealth: Payer: Self-pay

## 2022-03-28 ENCOUNTER — Emergency Department (HOSPITAL_COMMUNITY): Payer: BC Managed Care – PPO

## 2022-03-28 ENCOUNTER — Emergency Department (HOSPITAL_COMMUNITY)
Admission: EM | Admit: 2022-03-28 | Discharge: 2022-03-28 | Disposition: A | Discharge status: Home / Self Care | Payer: BC Managed Care – PPO | Attending: Emergency Medicine | Admitting: Emergency Medicine

## 2022-03-28 DIAGNOSIS — M2669 Other specified disorders of temporomandibular joint: Secondary | ICD-10-CM

## 2022-03-28 DIAGNOSIS — Z1152 Encounter for screening for COVID-19: Secondary | ICD-10-CM | POA: Diagnosis not present

## 2022-03-28 DIAGNOSIS — E039 Hypothyroidism, unspecified: Secondary | ICD-10-CM | POA: Insufficient documentation

## 2022-03-28 DIAGNOSIS — M26601 Right temporomandibular joint disorder, unspecified: Secondary | ICD-10-CM | POA: Diagnosis not present

## 2022-03-28 DIAGNOSIS — R6884 Jaw pain: Secondary | ICD-10-CM | POA: Diagnosis not present

## 2022-03-28 DIAGNOSIS — Z79890 Hormone replacement therapy (postmenopausal): Secondary | ICD-10-CM | POA: Insufficient documentation

## 2022-03-28 DIAGNOSIS — R9431 Abnormal electrocardiogram [ECG] [EKG]: Secondary | ICD-10-CM | POA: Diagnosis not present

## 2022-03-28 DIAGNOSIS — I10 Essential (primary) hypertension: Secondary | ICD-10-CM | POA: Insufficient documentation

## 2022-03-28 DIAGNOSIS — R079 Chest pain, unspecified: Secondary | ICD-10-CM | POA: Diagnosis not present

## 2022-03-28 LAB — CBC
HCT: 35.7 % — ABNORMAL LOW (ref 36.0–46.0)
Hemoglobin: 11.5 g/dL — ABNORMAL LOW (ref 12.0–15.0)
MCH: 28.2 pg (ref 26.0–34.0)
MCHC: 32.2 g/dL (ref 30.0–36.0)
MCV: 87.5 fL (ref 80.0–100.0)
Platelets: 229 10*3/uL (ref 150–400)
RBC: 4.08 MIL/uL (ref 3.87–5.11)
RDW: 15.2 % (ref 11.5–15.5)
WBC: 5.6 10*3/uL (ref 4.0–10.5)
nRBC: 0 % (ref 0.0–0.2)

## 2022-03-28 LAB — BASIC METABOLIC PANEL
Anion gap: 8 (ref 5–15)
BUN: 13 mg/dL (ref 6–20)
CO2: 21 mmol/L — ABNORMAL LOW (ref 22–32)
Calcium: 8.8 mg/dL — ABNORMAL LOW (ref 8.9–10.3)
Chloride: 108 mmol/L (ref 98–111)
Creatinine, Ser: 0.75 mg/dL (ref 0.44–1.00)
GFR, Estimated: >60 mL/min (ref >60)
Glucose, Bld: 127 mg/dL — ABNORMAL HIGH (ref 70–99)
Potassium: 3.4 mmol/L — ABNORMAL LOW (ref 3.5–5.1)
Sodium: 137 mmol/L (ref 135–145)

## 2022-03-28 LAB — RESP PANEL BY RT-PCR (RSV, FLU A&B, COVID)  RVPGX2
Influenza A by PCR: NEGATIVE
Influenza B by PCR: NEGATIVE
Resp Syncytial Virus by PCR: NEGATIVE
SARS Coronavirus 2 by RT PCR: NEGATIVE

## 2022-03-28 LAB — TROPONIN I (HIGH SENSITIVITY): Troponin I (High Sensitivity): <2 ng/L (ref <18)

## 2022-03-28 NOTE — Discharge Instructions (Addendum)
Please follow up with ENT and your PCP We will let you know what your swabs show It does not look like an ear infection and labs and cardiac work up look good

## 2022-03-28 NOTE — Telephone Encounter (Signed)
Reviewed document.  Agree with disposition to ER.

## 2022-03-28 NOTE — Telephone Encounter (Addendum)
Per access nurse note pt agreed to disposition of access nurse to go to ED. Pt has TOC with Dr Diona Browner 07/10/22. Sending note to Dutch Quint FNP and Dr Diona Browner and [place] pool. Per chart review tab pt did go to [facility] ED.

## 2022-03-28 NOTE — Telephone Encounter (Signed)
[place] Day - Client TELEPHONE ADVICE RECORD AccessNurse Patient Name: [name] Gender: Female DOB: [birth year] Age: 52 Y 11 M 30 D Return Phone Number: [phone] (Primary) Address: [place]  [address] Client [facility] Day - Client Client Site [place] - Day Provider Waunita Schooner- MD Contact Type Call Who Is Calling Patient / Member / Family / Caregiver Call Type Triage / Clinical Relationship To Patient Self Return Phone Number [phone] (Primary) Chief Complaint WEAKNESS - sudden on one side of face or body Reason for Call Symptomatic / Request for [name] states she is having pain in the jaw going into her shoulder and down the arm on her right side. She is also having hot flashes. Caller transferred from office. [name] Translation No Nurse Assessment Nurse: Alvis Lemmings, RN, Marcie Bal Date/Time [name] Time): 03/28/2022 11:47:03 AM Confirm and document reason for call. If symptomatic, describe symptoms. ---Caller states she is having pain in the right jaw going into her shoulder and down the arm on her right side. She is also feeling sweaty with it. Caller transferred from office. [name] Does the patient have any new or worsening symptoms? ---Yes Will a triage be completed? ---Yes Related visit to physician within the last 2 weeks? ---Yes Does the PT have any chronic conditions? (i.e. diabetes, asthma, this includes High risk factors for pregnancy, etc.) ---Yes List chronic conditions. ---HTN, seen for left side tinnitus, prediabetic, synthroid, right ear Is the patient pregnant or possibly pregnant? (Ask all females between the ages of 58-55) ---No Is this a behavioral health or substance abuse call? ---No Guidelines Guideline Title Affirmed Question Affirmed Notes  Nurse Date/Time [name] Time) Face Pain Difficulty breathing or unusual sweating Etter Sjogren 03/28/2022 11:50:06 AM PLEASE NOTE: All timestamps contained within this report are represented as [place] Standard Time. CONFIDENTIALTY NOTICE: This fax transmission is intended only for the addressee. It contains information that is legally privileged, confidential or otherwise protected from use or disclosure. If you are not the intended recipient, you are strictly prohibited from reviewing, disclosing, copying using or disseminating any of this information or taking any action in reliance on or regarding this information. If you have received this fax in error, please notify us immediately by telephone so that we can arrange for its return to [place]. Phone: [phone], Toll-Free: [phone], Fax: [phone] Page: 2 of 2 Call Id: [id number] Guidelines Guideline Title Affirmed Question Affirmed Notes Nurse Date/Time [name] Time) (e.g., sweating without exertion) Disp. Time [name] Time) Disposition Final User 03/28/2022 11:45:08 AM Send to Urgent [name] 03/28/2022 11:51:32 AM Go to ED Now Yes Alvis Lemmings, RN, Marcie Bal Final Disposition 03/28/2022 11:51:32 AM Go to ED Now Yes Alvis Lemmings, RN, [name] Disagree/Comply Comply Caller Understands Yes PreDisposition Call Doctor Care Advice Given Per Guideline GO TO ED NOW: CARE ADVICE given per Face Pain (Adult) guideline. * You need to be seen in the Emergency Department. * Leave now. Drive carefully. * Go to the ED at ___________ [place]: * Another adult should drive. BRING MEDICINES: * Bring a list of your current medicines when you go to the Emergency Department (ER). Referrals [place]

## 2022-03-28 NOTE — ED Provider Triage Note (Signed)
Emergency Medicine Provider Triage Evaluation Note  [name] , a 52 y.o. female  was evaluated in triage.  Pt complains of right jaw pain radiating to the right shoulder and down the right arm.  Has been present for 3 days.  Had associated chest pain yesterday which has resolved.  Also complaining of feeling as though she is hearing crickets in her ears.  The symptoms been present for 2 months.  Was told that it could be a sign of a stroke.  No recent change.  Cardiac history significant for hypertension.  Has been taking her medications as prescribed  Review of Systems  Positive: As above Negative: As above  Physical Exam  BP 109/78 (BP Location: Right Arm)   Pulse 75   Temp 98.5 F (36.9 C)   Resp 16   LMP 12/06/2014 (LMP Unknown)   SpO2 100%  Gen:   Awake, no distress   Resp:  Normal effort  MSK:   Moves extremities without difficulty  Other:    Medical Decision Making  Medically screening exam initiated at 12:46 PM.  Appropriate orders placed.  [name] was informed that the remainder of the evaluation will be completed by another provider, this initial triage assessment does not replace that evaluation, and the importance of remaining in the ED until their evaluation is complete.  ACS rule out   Nehemiah Massed 03/28/22 1247

## 2022-03-28 NOTE — ED Triage Notes (Addendum)
Pt c/o right side of jaw pain that radiates to right shoulder and right armx3d. Pt states it feel like she has an ear ache. Pt states, "I hear crickets or scratchy sounds in right ear." Pt states when she was at work yesterday she felt dizzy all day.

## 2022-03-28 NOTE — ED Provider Notes (Addendum)
[name] Provider Note   CSN: [id number] Arrival date & time: 03/28/22  1217     History  Chief Complaint  Patient presents with   body pain   [name] is a 52 y.o. female PMH hypothyroidism and HTN, and tinnitus of both ears here for history of pain in right jaw that radiated to her shoulder and arms for the past 3 days intermittently.  She denies any chest pain during these events.  She says for the past 3 days has also been having chills and feeling body aches as well as limbs feeling heavy.  She hears a ringing noise in her head.  She denies any spinning of the room symptoms.  She says that the pain in her jaw worsens with eating/chewing and turning her head to the right.  She feels pain behind her right ear.  Denies any ear drainage.  Says she has been having some postnasal drip for a while.  Denies any cough, sneezing, vomiting.  Has been having some nausea for the past 3 days on and off.  Is any history of migraines although she says she had many headaches last year.  She denies any weakness or sensation differences in her arms.  She denies any trauma to her head or neck.   Patient also mentions that she has some lower abdominal pain that comes on and off that she attributes to constipation.  She had a bowel movement yesterday but said it was very hard.  Denies any vomiting.  The only symptom that she is having currently is pain behind her right ear and "leg heaviness" as well as chills.        Home Medications Prior to Admission medications   Medication Sig Start Date End Date Taking? Authorizing Provider  levothyroxine (SYNTHROID) 75 MCG tablet Take 75 mcg by mouth every morning. 03/06/21   [provider]      Allergies    Vicodin [hydrocodone-acetaminophen]    Review of Systems   Review of Systems  Constitutional:  Positive for chills. Negative for fever.  HENT:  Positive for ear pain and tinnitus. Negative for  sore throat.   Eyes:  Negative for pain, redness and visual disturbance.  Respiratory:  Negative for cough, chest tightness and shortness of breath.   Cardiovascular:  Negative for chest pain, palpitations and leg swelling.  Gastrointestinal:  Positive for constipation. Negative for abdominal pain, diarrhea, nausea and vomiting.  Genitourinary:  Negative for dysuria and hematuria.  Musculoskeletal:  Negative for arthralgias and back pain.  Skin:  Negative for color change and rash.  Neurological:  Negative for dizziness, seizures, syncope, facial asymmetry, weakness, numbness and headaches.  All other systems reviewed and are negative.   Physical Exam Updated Vital Signs BP 112/74   Pulse 64   Temp 98.3 F (36.8 C) (Oral)   Resp 17   LMP 12/06/2014 (LMP Unknown)   SpO2 100%  Physical Exam Vitals and nursing note reviewed.  Constitutional:      General: She is not in acute distress.    Appearance: She is well-developed.  HENT:     Head: Normocephalic and atraumatic.     Right Ear: Tympanic membrane, ear canal and external ear normal.     Left Ear: Tympanic membrane, ear canal and external ear normal.     Ears:     Comments: Mild tenderness in postauricular region of right ear    Nose: Nose normal.  Mouth/Throat:     Mouth: Mucous membranes are moist.  Eyes:     Conjunctiva/sclera: Conjunctivae normal.  Cardiovascular:     Rate and Rhythm: Normal rate and regular rhythm.     Heart sounds: No murmur heard. Pulmonary:     Effort: Pulmonary effort is normal. No respiratory distress.     Breath sounds: Normal breath sounds.  Abdominal:     Palpations: Abdomen is soft.     Tenderness: There is no abdominal tenderness.  Musculoskeletal:        General: No swelling.     Cervical back: Neck supple.  Skin:    General: Skin is warm and dry.     Capillary Refill: Capillary refill takes less than 2 seconds.  Neurological:     General: No focal deficit present.     Mental  Status: She is alert.     Comments: CN II: PERRL CN III, IV,VI: EOMI, no nystagmus CV V: Normal sensation in V1, V2, V3 CVII: Symmetric smile and brow raise CN VIII: Normal hearing CN IX,X: Symmetric palate raise  CN XI: 5/5 shoulder shrug CN XII: Symmetric tongue protrusion  UE and LE strength 5/5 Normal sensation in UE and LE bilaterally  No ataxia with finger to nose    Psychiatric:        Mood and Affect: Mood normal.     ED Results / Procedures / Treatments   Labs Labs Reviewed  BASIC METABOLIC PANEL - Abnormal; Notable for the following components:      Result Value   Potassium 3.4 (*)    CO2 21 (*)    Glucose, Bld 127 (*)    Calcium 8.8 (*)    All other components within normal limits  CBC - Abnormal; Notable for the following components:   Hemoglobin 11.5 (*)    HCT 35.7 (*)    All other components within normal limits  RESP PANEL BY RT-PCR (RSV, FLU A&B, COVID)  [id number]  TROPONIN I (HIGH SENSITIVITY)    EKG EKG Interpretation  Date/Time:  Thursday March 28 2022 12:13:35 EST Ventricular Rate:  71 PR Interval:  160 QRS Duration: 82 QT Interval:  366 QTC Calculation: 397 R Axis:   30 Text Interpretation: Normal sinus rhythm Normal ECG No previous ECGs available Confirmed by Leanord Asal ([id number]) on 03/28/2022 4:04:41 PM  Radiology DG Chest 2 View  Result Date: 03/28/2022 CLINICAL DATA:  Chest pain EXAM: CHEST - 2 VIEW COMPARISON:  06/03/2019 FINDINGS: The cardiac silhouette, mediastinal and hilar contours are normal. The lungs are clear. No pleural effusions. No pulmonary lesions. The bony thorax is intact. IMPRESSION: No acute cardiopulmonary findings. Electronically Signed   By: Marijo Sanes M.D.   On: 03/28/2022 13:21    Procedures Procedures   Medications Ordered in ED Medications - No data to display  ED Course/ Medical Decision Making/ A&P                           Medical Decision Making 52 year old female who presented today with right  jaw claudication symptoms that had originally radiated down to her right arm.  Cardiac workup with negative troponin, EKG NSR and patient without any chest pain.  Chest x-ray without any sign of pneumonia, no symptoms of cough.  CBC without anemia or leukocytosis.  BMP without any acute issues.  On the setting of patient only had pain behind right ear.  Has history of tinnitus per chart review  that has been ongoing.  Believes that this is most likely related to viral etiology that is causing postauricular lymph node tenderness/reactive lymph node.  Does not appear to have ear infection on examination.  Ordered cervical nerve compression however patient did not have any weakness on examination or sensation differences.  Could consider cervical MRI outpatient if continuing to have pain.  Did not appear to be temporal arteritis picture as patient did not have any temple pain or pharyngitis.  No neurologic deficits on examination that would make me think of an acute stroke.  I obtained a COVID flu for patient and she was discharged in stable condition.  Place ENT referral for patient for follow-up of tinnitus/ear pain.  May follow-up with PCP as needed.    Final Clinical Impression(s) / ED Diagnoses Final diagnoses:  Jaw claudication    Rx / DC Orders ED Discharge Orders     None         Gerrit Heck, MD 03/28/22 1747    Gerrit Heck, MD 03/29/22 [place], [place], MD 03/29/22 [place], [place], DO 03/29/22 1635

## 2022-03-28 NOTE — ED Notes (Signed)
Patient updated on the plan of care at this time and verbalizes understanding. Phlebotomy at bedside to collect second trop

## 2022-04-30 DIAGNOSIS — Z049 Encounter for examination and observation for unspecified reason: Secondary | ICD-10-CM | POA: Diagnosis not present

## 2022-04-30 DIAGNOSIS — R519 Headache, unspecified: Secondary | ICD-10-CM | POA: Diagnosis not present

## 2022-05-01 ENCOUNTER — Other Ambulatory Visit: Payer: Self-pay | Admitting: Specialist

## 2022-05-01 DIAGNOSIS — R519 Headache, unspecified: Secondary | ICD-10-CM

## 2022-05-21 ENCOUNTER — Ambulatory Visit
Admission: RE | Admit: 2022-05-21 | Discharge: 2022-05-21 | Disposition: A | Discharge status: Home / Self Care | Payer: BC Managed Care – PPO | Source: Ambulatory Visit | Attending: Specialist | Admitting: Specialist

## 2022-05-21 DIAGNOSIS — R519 Headache, unspecified: Secondary | ICD-10-CM

## 2022-05-21 MED ORDER — GADOPICLENOL 0.5 MMOL/ML IV SOLN
7.5000 mL | Freq: Once | INTRAVENOUS | Status: AC | PRN
  Administered 2022-05-21: 7.5 mL via INTRAVENOUS

## 2022-07-10 ENCOUNTER — Ambulatory Visit (INDEPENDENT_AMBULATORY_CARE_PROVIDER_SITE_OTHER): Payer: BC Managed Care – PPO | Admitting: Family Medicine

## 2022-07-10 ENCOUNTER — Encounter: Payer: Self-pay | Admitting: Family Medicine

## 2022-07-10 VITALS — BP 108/70 | HR 75 | Temp 97.2°F | Ht 63.0 in | Wt 176.0 lb

## 2022-07-10 DIAGNOSIS — E890 Postprocedural hypothyroidism: Secondary | ICD-10-CM

## 2022-07-10 DIAGNOSIS — R7303 Prediabetes: Secondary | ICD-10-CM

## 2022-07-10 DIAGNOSIS — D509 Iron deficiency anemia, unspecified: Secondary | ICD-10-CM

## 2022-07-10 DIAGNOSIS — Z85850 Personal history of malignant neoplasm of thyroid: Secondary | ICD-10-CM

## 2022-07-10 DIAGNOSIS — R09A2 Foreign body sensation, throat: Secondary | ICD-10-CM

## 2022-07-10 DIAGNOSIS — I10 Essential (primary) hypertension: Secondary | ICD-10-CM | POA: Diagnosis not present

## 2022-07-10 LAB — CBC WITH DIFFERENTIAL/PLATELET
Basophils Absolute: 0 10*3/uL (ref 0.0–0.1)
Basophils Relative: 0.6 % (ref 0.0–3.0)
Eosinophils Absolute: 0.2 10*3/uL (ref 0.0–0.7)
Eosinophils Relative: 3 % (ref 0.0–5.0)
HCT: 36 % (ref 36.0–46.0)
Hemoglobin: 11.6 g/dL — ABNORMAL LOW (ref 12.0–15.0)
Lymphocytes Relative: 49.3 % — ABNORMAL HIGH (ref 12.0–46.0)
Lymphs Abs: 3.3 10*3/uL (ref 0.7–4.0)
MCHC: 32.4 g/dL (ref 30.0–36.0)
MCV: 86.7 fl (ref 78.0–100.0)
Monocytes Absolute: 0.5 10*3/uL (ref 0.1–1.0)
Monocytes Relative: 7.4 % (ref 3.0–12.0)
Neutro Abs: 2.6 10*3/uL (ref 1.4–7.7)
Neutrophils Relative %: 39.7 % — ABNORMAL LOW (ref 43.0–77.0)
Platelets: 242 10*3/uL (ref 150.0–400.0)
RBC: 4.15 Mil/uL (ref 3.87–5.11)
RDW: 14.5 % (ref 11.5–15.5)
WBC: 6.6 10*3/uL (ref 4.0–10.5)

## 2022-07-10 LAB — COMPREHENSIVE METABOLIC PANEL
ALT: 14 U/L (ref 0–35)
AST: 16 U/L (ref 0–37)
Albumin: 4.3 g/dL (ref 3.5–5.2)
Alkaline Phosphatase: 69 U/L (ref 39–117)
BUN: 16 mg/dL (ref 6–23)
CO2: 26 mEq/L (ref 19–32)
Calcium: 9.4 mg/dL (ref 8.4–10.5)
Chloride: 107 mEq/L (ref 96–112)
Creatinine, Ser: 0.72 mg/dL (ref 0.40–1.20)
GFR: 94.88 mL/min (ref >60.00)
Glucose, Bld: 88 mg/dL (ref 70–99)
Potassium: 3.7 mEq/L (ref 3.5–5.1)
Sodium: 141 mEq/L (ref 135–145)
Total Bilirubin: 0.4 mg/dL (ref 0.2–1.2)
Total Protein: 7.6 g/dL (ref 6.0–8.3)

## 2022-07-10 LAB — LIPID PANEL
Cholesterol: 134 mg/dL (ref 0–200)
HDL: 49.8 mg/dL (ref >39.00)
LDL Cholesterol: 68 mg/dL (ref 0–99)
NonHDL: 84.68
Total CHOL/HDL Ratio: 3
Triglycerides: 81 mg/dL (ref 0.0–149.0)
VLDL: 16.2 mg/dL (ref 0.0–40.0)

## 2022-07-10 LAB — T4, FREE: Free T4: 1.09 ng/dL (ref 0.60–1.60)

## 2022-07-10 LAB — T3, FREE: T3, Free: 2.8 pg/mL (ref 2.3–4.2)

## 2022-07-10 LAB — HEMOGLOBIN A1C: Hgb A1c MFr Bld: 6.5 % (ref 4.6–6.5)

## 2022-07-10 LAB — TSH: TSH: 2.16 u[IU]/mL (ref 0.35–5.50)

## 2022-07-10 MED ORDER — AMLODIPINE BESYLATE 10 MG PO TABS: 10.0000 mg | ORAL_TABLET | Freq: Every day | ORAL | 3 refills | Status: DC

## 2022-07-10 NOTE — Assessment & Plan Note (Signed)
Due for re-eval. 

## 2022-07-10 NOTE — Patient Instructions (Addendum)
Please stop at the lab to have labs drawn.   MyChart user ID: [email or web address] 

## 2022-07-10 NOTE — Assessment & Plan Note (Signed)
Stable, chronic.  Continue current medication.  Amlodipine 10 mg daily 

## 2022-07-10 NOTE — Assessment & Plan Note (Signed)
Hx of papillary adenocarcinoma of thyroid Dx 2022 s/p  total thyroidectomy, sees endocrinologist Dr. Westley Chandler

## 2022-07-10 NOTE — Assessment & Plan Note (Signed)
Due for recheck.  Was due to menorrhagia in past.

## 2022-07-10 NOTE — Assessment & Plan Note (Signed)
Due for re-eval on levo 88 mcg daily.

## 2022-07-10 NOTE — Assessment & Plan Note (Signed)
Previous PCP recommended ENT evaluation.  She plans to move forward with this as ENT office: Saw her just rescheduled. No but this is reasonable given her history of thyroid cancer current although mild symptoms. We did discuss that the globus sensation may be due to inadequately treated reflux.

## 2022-07-10 NOTE — Progress Notes (Signed)
Patient ID: [name], female    DOB: 08-06-1970, 52 y.o.   MRN: [id number]  This visit was conducted in person.  BP 108/70 (BP Location: Left Arm, Patient Position: Sitting, Cuff Size: Normal)   Pulse 75   Temp (!) 97.2 F (36.2 C) (Temporal)   Ht 5\' 3"  (1.6 m)   Wt 176 lb (79.8 kg)   LMP 12/06/2014 (LMP Unknown)   SpO2 99%   BMI 31.18 kg/m    CC:  Chief Complaint  Patient presents with   Transitions Of Care    Wants labs done today    Subjective:   HPI: [name] is a 52 y.o. female presenting on 07/10/2022 for Transitions Of Care (Wants labs done today)   Previous MD: CODY  Last CPX: 2022  No GYN.  Hypertension:   Well controlled on amlodipine 10 mg daily BP Readings from Last 3 Encounters:  07/10/22 108/70  03/28/22 112/74  03/13/22 100/70  Using medication without problems or lightheadedness:  Chest pain with exertion: Edema: Short of breath: Average home BPs: Other issues:  Post ablative hypothyroidism:  levothyroxine 88 mcg  Hx of papillary adenocarcinoma of thyroid Dx 2022 s/p  total thyroidectomy, seesendocrinologist. Lab Results  Component Value Date   TSH 0.60 05/11/2020   Prediabetes  Lab Results  Component Value Date   HGBA1C 6.2 (A) 01/21/2022     Wt Readings from Last 3 Encounters:  07/10/22 176 lb (79.8 kg)  03/13/22 178 lb 6 oz (80.9 kg)  01/21/22 181 lb 2 oz (82.2 kg)   Body mass index is 31.18 kg/m.      Relevant past medical, surgical, family and social history reviewed and updated as indicated. Interim medical history since our last visit reviewed. Allergies and medications reviewed and updated. Outpatient Medications Prior to Visit  Medication Sig Dispense Refill   levothyroxine (SYNTHROID) 75 MCG tablet Take 75 mcg by mouth every morning.     amLODipine (NORVASC) 10 MG tablet Take 10 mg by mouth daily.     No facility-administered medications prior to visit.     Per HPI unless specifically indicated in ROS  section below Review of Systems  Constitutional:  Negative for fatigue and fever.  HENT:  Negative for congestion.   Eyes:  Negative for pain.  Respiratory:  Negative for cough and shortness of breath.   Cardiovascular:  Negative for chest pain, palpitations and leg swelling.  Gastrointestinal:  Negative for abdominal pain.  Genitourinary:  Negative for dysuria and vaginal bleeding.  Musculoskeletal:  Negative for back pain.  Neurological:  Negative for syncope, light-headedness and headaches.  Psychiatric/Behavioral:  Negative for dysphoric mood.    Objective:  BP 108/70 (BP Location: Left Arm, Patient Position: Sitting, Cuff Size: Normal)   Pulse 75   Temp (!) 97.2 F (36.2 C) (Temporal)   Ht 5\' 3"  (1.6 m)   Wt 176 lb (79.8 kg)   LMP 12/06/2014 (LMP Unknown)   SpO2 99%   BMI 31.18 kg/m   Wt Readings from Last 3 Encounters:  07/10/22 176 lb (79.8 kg)  03/13/22 178 lb 6 oz (80.9 kg)  01/21/22 181 lb 2 oz (82.2 kg)      Physical Exam Constitutional:      General: She is not in acute distress.    Appearance: Normal appearance. She is well-developed. She is not ill-appearing or toxic-appearing.  HENT:     Head: Normocephalic.     Right Ear: Hearing, tympanic membrane, ear  canal and external ear normal. Tympanic membrane is not erythematous, retracted or bulging.     Left Ear: Hearing, tympanic membrane, ear canal and external ear normal. Tympanic membrane is not erythematous, retracted or bulging.     Nose: No mucosal edema or rhinorrhea.     Right Sinus: No maxillary sinus tenderness or frontal sinus tenderness.     Left Sinus: No maxillary sinus tenderness or frontal sinus tenderness.     Mouth/Throat:     Pharynx: Uvula midline.  Eyes:     General: Lids are normal. Lids are everted, no foreign bodies appreciated.     Conjunctiva/sclera: Conjunctivae normal.     Pupils: Pupils are equal, round, and reactive to light.  Neck:     Thyroid: No thyroid mass or thyromegaly.      Vascular: No carotid bruit.     Trachea: Trachea normal.  Cardiovascular:     Rate and Rhythm: Normal rate and regular rhythm.     Pulses: Normal pulses.     Heart sounds: Normal heart sounds, S1 normal and S2 normal. No murmur heard.    No friction rub. No gallop.  Pulmonary:     Effort: Pulmonary effort is normal. No tachypnea or respiratory distress.     Breath sounds: Normal breath sounds. No decreased breath sounds, wheezing, rhonchi or rales.  Abdominal:     General: Bowel sounds are normal.     Palpations: Abdomen is soft.     Tenderness: There is no abdominal tenderness.  Musculoskeletal:     Cervical back: Normal range of motion and neck supple.  Skin:    General: Skin is warm and dry.     Findings: No rash.  Neurological:     Mental Status: She is alert.  Psychiatric:        Mood and Affect: Mood is not anxious or depressed.        Speech: Speech normal.        Behavior: Behavior normal. Behavior is cooperative.        Thought Content: Thought content normal.        Judgment: Judgment normal.       Results for orders placed or performed during the hospital encounter of 03/28/22  Resp panel by RT-PCR (RSV, Flu A&B, Covid) Anterior Nasal Swab   Specimen: Anterior Nasal Swab  Result Value Ref Range   SARS Coronavirus 2 by RT PCR NEGATIVE NEGATIVE   Influenza A by PCR NEGATIVE NEGATIVE   Influenza B by PCR NEGATIVE NEGATIVE   Resp Syncytial Virus by PCR NEGATIVE NEGATIVE  Basic metabolic panel  Result Value Ref Range   Sodium 137 135 - 145 mmol/L   Potassium 3.4 (L) 3.5 - 5.1 mmol/L   Chloride 108 98 - 111 mmol/L   CO2 21 (L) 22 - 32 mmol/L   Glucose, Bld 127 (H) 70 - 99 mg/dL   BUN 13 6 - 20 mg/dL   Creatinine, Ser 7.82 0.44 - 1.00 mg/dL   Calcium 8.8 (L) 8.9 - 10.3 mg/dL   GFR, Estimated >95 >62 mL/min   Anion gap 8 5 - 15  CBC  Result Value Ref Range   WBC 5.6 4.0 - 10.5 K/uL   RBC 4.08 3.87 - 5.11 MIL/uL   Hemoglobin 11.5 (L) 12.0 - 15.0 g/dL    HCT 13.0 (L) 86.5 - 46.0 %   MCV 87.5 80.0 - 100.0 fL   MCH 28.2 26.0 - 34.0 pg   MCHC 32.2 30.0 -  36.0 g/dL   RDW 16.1 09.6 - 04.5 %   Platelets 229 150 - 400 K/uL   nRBC 0.0 0.0 - 0.2 %  Troponin I (High Sensitivity)  Result Value Ref Range   Troponin I (High Sensitivity) <2 <18 ng/L    Assessment and Plan  HYPERTENSION, BENIGN ESSENTIAL Assessment & Plan: Stable, chronic.  Continue current medication.   Amlodipine 10 mg daily.  Orders: -     Lipid panel -     Comprehensive metabolic panel  Postoperative hypothyroidism Assessment & Plan:  Due for re-eval on levo 88 mcg daily.  Orders: -     T4, free -     T3, free -     TSH  Iron deficiency anemia, unspecified iron deficiency anemia type Assessment & Plan:   Due for recheck.  Was due to menorrhagia in past.  Orders: -     CBC with Differential/Platelet  Globus sensation Assessment & Plan: Previous PCP recommended ENT evaluation.  She plans to move forward with this as ENT office: Saw her just rescheduled. No but this is reasonable given her history of thyroid cancer current although mild symptoms. We did discuss that the globus sensation may be due to inadequately treated reflux.   History of papillary adenocarcinoma of thyroid Assessment & Plan:  Hx of papillary adenocarcinoma of thyroid Dx 2022 s/p  total thyroidectomy, sees endocrinologist Dr. Westley Chandler   Prediabetes Assessment & Plan:  Due for re-eval.  Orders: -     Hemoglobin A1c  Other orders -     amLODIPine Besylate; Take 1 tablet (10 mg total) by mouth daily.  Dispense: 90 tablet; Refill: 3    Return in about 6 weeks (around 08/21/2022) for anuual physical .   Kerby Nora, MD

## 2023-01-02 ENCOUNTER — Other Ambulatory Visit: Payer: Self-pay | Admitting: Family Medicine

## 2023-01-02 DIAGNOSIS — Z1231 Encounter for screening mammogram for malignant neoplasm of breast: Secondary | ICD-10-CM

## 2023-01-08 ENCOUNTER — Other Ambulatory Visit: Payer: BC Managed Care – PPO

## 2023-01-15 ENCOUNTER — Encounter: Payer: BC Managed Care – PPO | Admitting: Family Medicine

## 2023-01-21 ENCOUNTER — Telehealth: Payer: Self-pay | Admitting: *Deleted

## 2023-01-21 DIAGNOSIS — R7303 Prediabetes: Secondary | ICD-10-CM

## 2023-01-21 DIAGNOSIS — I10 Essential (primary) hypertension: Secondary | ICD-10-CM

## 2023-01-21 NOTE — Telephone Encounter (Signed)
-----   Message from Vincenza Hews sent at 01/20/2023  9:55 AM EST ----- Regarding: Lab Thurs 01/30/23 Hello,  Patient is coming in for CPE labs on Thursday 01/30/23. Can we get orders please.   Thanks

## 2023-01-24 ENCOUNTER — Ambulatory Visit: Payer: BC Managed Care – PPO

## 2023-01-30 ENCOUNTER — Other Ambulatory Visit: Payer: BC Managed Care – PPO

## 2023-01-30 DIAGNOSIS — R7303 Prediabetes: Secondary | ICD-10-CM

## 2023-01-30 DIAGNOSIS — I10 Essential (primary) hypertension: Secondary | ICD-10-CM | POA: Diagnosis not present

## 2023-01-30 LAB — COMPREHENSIVE METABOLIC PANEL
ALT: 15 U/L (ref 0–35)
AST: 18 U/L (ref 0–37)
Albumin: 4.3 g/dL (ref 3.5–5.2)
Alkaline Phosphatase: 77 U/L (ref 39–117)
BUN: 13 mg/dL (ref 6–23)
CO2: 28 meq/L (ref 19–32)
Calcium: 9.2 mg/dL (ref 8.4–10.5)
Chloride: 106 meq/L (ref 96–112)
Creatinine, Ser: 0.78 mg/dL (ref 0.40–1.20)
GFR: 85.86 mL/min (ref >60.00)
Glucose, Bld: 96 mg/dL (ref 70–99)
Potassium: 3.6 meq/L (ref 3.5–5.1)
Sodium: 140 meq/L (ref 135–145)
Total Bilirubin: 0.5 mg/dL (ref 0.2–1.2)
Total Protein: 7.7 g/dL (ref 6.0–8.3)

## 2023-01-30 LAB — LIPID PANEL
Cholesterol: 153 mg/dL (ref 0–200)
HDL: 48.7 mg/dL (ref >39.00)
LDL Cholesterol: 90 mg/dL (ref 0–99)
NonHDL: 104.22
Total CHOL/HDL Ratio: 3
Triglycerides: 70 mg/dL (ref 0.0–149.0)
VLDL: 14 mg/dL (ref 0.0–40.0)

## 2023-01-30 LAB — HEMOGLOBIN A1C: Hgb A1c MFr Bld: 6.7 % — ABNORMAL HIGH (ref 4.6–6.5)

## 2023-01-31 NOTE — Progress Notes (Signed)
No critical labs need to be addressed urgently. We will discuss labs in detail at upcoming office visit.   

## 2023-02-06 ENCOUNTER — Other Ambulatory Visit (HOSPITAL_COMMUNITY)
Admission: RE | Admit: 2023-02-06 | Discharge: 2023-02-06 | Disposition: A | Discharge status: Home / Self Care | Payer: BC Managed Care – PPO | Source: Ambulatory Visit | Attending: Family Medicine | Admitting: Family Medicine

## 2023-02-06 ENCOUNTER — Encounter: Payer: Self-pay | Admitting: Family Medicine

## 2023-02-06 ENCOUNTER — Ambulatory Visit (INDEPENDENT_AMBULATORY_CARE_PROVIDER_SITE_OTHER): Payer: BC Managed Care – PPO | Admitting: Family Medicine

## 2023-02-06 VITALS — BP 94/60 | HR 88 | Temp 97.7°F | Ht 62.0 in | Wt 179.1 lb

## 2023-02-06 DIAGNOSIS — E890 Postprocedural hypothyroidism: Secondary | ICD-10-CM

## 2023-02-06 DIAGNOSIS — Z0000 Encounter for general adult medical examination without abnormal findings: Secondary | ICD-10-CM | POA: Diagnosis not present

## 2023-02-06 DIAGNOSIS — Z124 Encounter for screening for malignant neoplasm of cervix: Secondary | ICD-10-CM

## 2023-02-06 DIAGNOSIS — R7303 Prediabetes: Secondary | ICD-10-CM

## 2023-02-06 DIAGNOSIS — I10 Essential (primary) hypertension: Secondary | ICD-10-CM | POA: Diagnosis not present

## 2023-02-06 MED ORDER — AMLODIPINE BESYLATE 10 MG PO TABS: 10.0000 mg | ORAL_TABLET | Freq: Every day | ORAL | 3 refills | Status: DC

## 2023-02-06 NOTE — Patient Instructions (Signed)
Get back on track with low carb heart healthy diet, weight loss and exercise.

## 2023-02-06 NOTE — Progress Notes (Signed)
Patient ID: [name], female    DOB: [birth year], 52 y.o.   MRN: [id number]  This visit was conducted in person.  BP 94/60 (BP Location: Right Arm, Patient Position: Sitting, Cuff Size: Large)   Pulse 88   Temp 97.7 F (36.5 C) (Temporal)   Ht 5\' 2"  (1.575 m)   Wt 179 lb 2 oz (81.3 kg)   LMP 12/06/2014 (LMP Unknown)   SpO2 98%   BMI 32.76 kg/m    CC:  Chief Complaint  Patient presents with   Annual Exam    Subjective:   HPI: [name] is a 52 y.o. female presenting on 02/06/2023 for Annual Exam  The patient presents for annual medicare wellness, complete physical and review of chronic health problems. He/She also has the following acute concerns today: none   Last CPX: 2022  No GYN.  Hypertension:   Well controlled on amlodipine 10 mg daily BP Readings from Last 3 Encounters:  02/06/23 94/60  07/10/22 108/70  03/28/22 112/74  Using medication without problems or lightheadedness:  none Chest pain with exertion: none Edema:none Short of breath: none Average home BPs: 110/72 Other issues:  Post ablative hypothyroidism:  levothyroxine 88 mcg  Hx of papillary adenocarcinoma of thyroid Dx 2022 s/p  total thyroidectomy, seesendocrinologist. Lab Results  Component Value Date   TSH 2.16 07/10/2022   Prediabetes ... Worsened control.. A1C now in DM range.  She recently was in [place] but has not been eating right Lab Results  Component Value Date   HGBA1C 6.7 (H) 01/30/2023     Wt Readings from Last 3 Encounters:  02/06/23 179 lb 2 oz (81.3 kg)  07/10/22 176 lb (79.8 kg)  03/13/22 178 lb 6 oz (80.9 kg)   Body mass index is 32.76 kg/m.      Relevant past medical, surgical, family and social history reviewed and updated as indicated. Interim medical history since our last visit reviewed. Allergies and medications reviewed and updated. Outpatient Medications Prior to Visit  Medication Sig Dispense Refill   levothyroxine (SYNTHROID) 88 MCG tablet  Take 88 mcg by mouth every morning.     amLODipine (NORVASC) 10 MG tablet Take 1 tablet (10 mg total) by mouth daily. 90 tablet 3   levothyroxine (SYNTHROID) 75 MCG tablet Take 75 mcg by mouth every morning.     No facility-administered medications prior to visit.     Per HPI unless specifically indicated in ROS section below Review of Systems  Constitutional:  Negative for fatigue and fever.  HENT:  Negative for congestion.   Eyes:  Negative for pain.  Respiratory:  Negative for cough and shortness of breath.   Cardiovascular:  Negative for chest pain, palpitations and leg swelling.  Gastrointestinal:  Negative for abdominal pain.  Genitourinary:  Negative for dysuria and vaginal bleeding.  Musculoskeletal:  Negative for back pain.  Neurological:  Negative for syncope, light-headedness and headaches.  Psychiatric/Behavioral:  Negative for dysphoric mood.    Objective:  BP 94/60 (BP Location: Right Arm, Patient Position: Sitting, Cuff Size: Large)   Pulse 88   Temp 97.7 F (36.5 C) (Temporal)   Ht 5\' 2"  (1.575 m)   Wt 179 lb 2 oz (81.3 kg)   LMP 12/06/2014 (LMP Unknown)   SpO2 98%   BMI 32.76 kg/m   Wt Readings from Last 3 Encounters:  02/06/23 179 lb 2 oz (81.3 kg)  07/10/22 176 lb (79.8 kg)  03/13/22 178 lb 6 oz (80.9 kg)  Physical Exam Exam conducted with a chaperone present.  Constitutional:      General: She is not in acute distress.    Appearance: Normal appearance. She is well-developed. She is not ill-appearing or toxic-appearing.  HENT:     Head: Normocephalic.     Right Ear: Hearing, tympanic membrane, ear canal and external ear normal. Tympanic membrane is not erythematous, retracted or bulging.     Left Ear: Hearing, tympanic membrane, ear canal and external ear normal. Tympanic membrane is not erythematous, retracted or bulging.     Nose: Nose normal. No mucosal edema or rhinorrhea.     Right Sinus: No maxillary sinus tenderness or frontal sinus  tenderness.     Left Sinus: No maxillary sinus tenderness or frontal sinus tenderness.     Mouth/Throat:     Pharynx: Uvula midline.  Eyes:     General: Lids are normal. Lids are everted, no foreign bodies appreciated.     Extraocular Movements: EOM normal.     Conjunctiva/sclera: Conjunctivae normal.     Pupils: Pupils are equal, round, and reactive to light.  Neck:     Thyroid: No thyroid mass or thyromegaly.     Vascular: No carotid bruit.     Trachea: Trachea normal.  Cardiovascular:     Rate and Rhythm: Normal rate and regular rhythm.     Pulses: Normal pulses.     Heart sounds: Normal heart sounds, S1 normal and S2 normal. No murmur heard.    No friction rub. No gallop.  Pulmonary:     Effort: Pulmonary effort is normal. No tachypnea or respiratory distress.     Breath sounds: Normal breath sounds. No decreased breath sounds, wheezing, rhonchi or rales.  Abdominal:     General: Bowel sounds are normal. There is no distension or abdominal bruit.     Palpations: Abdomen is soft. There is no fluid wave or mass.     Tenderness: There is no abdominal tenderness. There is no CVA tenderness, guarding or rebound.     Hernia: No hernia is present.  Genitourinary:    Exam position: Lithotomy position.     Labia:        Right: No rash, tenderness or lesion.        Left: No rash, tenderness or lesion.      Vagina: Normal.     Cervix: No cervical motion tenderness, discharge or friability.     Uterus: Normal. Not enlarged and not tender.      Adnexa:        Right: No mass, tenderness or fullness.         Left: No mass, tenderness or fullness.    Musculoskeletal:     Cervical back: Normal range of motion and neck supple.  Lymphadenopathy:     Cervical: No cervical adenopathy.  Skin:    General: Skin is warm, dry and intact.     Findings: No rash.  Neurological:     Mental Status: She is alert.     Cranial Nerves: No cranial nerve deficit.     Sensory: No sensory deficit.   Psychiatric:        Mood and Affect: Mood is not anxious or depressed.        Speech: Speech normal.        Behavior: Behavior normal. Behavior is cooperative.        Thought Content: Thought content normal.        Cognition and Memory: Cognition and memory  normal.        Judgment: Judgment normal.       Results for orders placed or performed in visit on 01/30/23  Lipid panel  Result Value Ref Range   Cholesterol 153 0 - 200 mg/dL   Triglycerides 60.6 0.0 - 149.0 mg/dL   HDL 30.16 >01.09 mg/dL   VLDL 32.3 0.0 - 55.7 mg/dL   LDL Cholesterol 90 0 - 99 mg/dL   Total CHOL/HDL Ratio 3    NonHDL 104.22   Hemoglobin A1c  Result Value Ref Range   Hgb A1c MFr Bld 6.7 (H) 4.6 - 6.5 %  Comprehensive metabolic panel  Result Value Ref Range   Sodium 140 135 - 145 mEq/L   Potassium 3.6 3.5 - 5.1 mEq/L   Chloride 106 96 - 112 mEq/L   CO2 28 19 - 32 mEq/L   Glucose, Bld 96 70 - 99 mg/dL   BUN 13 6 - 23 mg/dL   Creatinine, Ser 3.22 0.40 - 1.20 mg/dL   Total Bilirubin 0.5 0.2 - 1.2 mg/dL   Alkaline Phosphatase 77 39 - 117 U/L   AST 18 0 - 37 U/L   ALT 15 0 - 35 U/L   Total Protein 7.7 6.0 - 8.3 g/dL   Albumin 4.3 3.5 - 5.2 g/dL   GFR 02.54 >27.06 mL/min   Calcium 9.2 8.4 - 10.5 mg/dL    Assessment and Plan The patient's preventative maintenance and recommended screening tests for an annual wellness exam were reviewed in full today. Brought up to date unless services declined.  Counselled on the importance of diet, exercise, and its role in overall health and mortality. The patient's FH and SH was reviewed, including their home life, tobacco status, and drug and alcohol status.   Vaccines: Up-to-date with Shingrix 2024, COVID-vaccine x 2, tetanus 2019 Pap/DVE: 07/2015 repeat due today Mammo: scheduled 02/2023 Bone Density: Not indicated Colon: March 02, 2019 recommended every 7 years Smoking Status: None ETOH/ drug use: None/none  Hep C: Done  HIV screen: Done  Routine  general medical examination at a health care facility  HYPERTENSION, BENIGN ESSENTIAL Assessment & Plan: Stable, chronic.  Continue current medication.   Amlodipine 10 mg daily.   Postoperative hypothyroidism Assessment & Plan: Stable, chronic.  Continue current medication.  evo 88 mcg daily.   Cervical cancer screening -     Cytology - PAP  Prediabetes Assessment & Plan: +Recetn worsening with trip to [place] for months... will get back to regular lifestyle and re-eval in 3 months.  Will hold off on new DM diagnosis unless persistently elevated.  Encouraged exercise, weight loss, healthy eating habits.    Other orders -     amLODIPine Besylate; Take 1 tablet (10 mg total) by mouth daily.  Dispense: 90 tablet; Refill: 3     Return in about 3 months (around 05/09/2023) for diabetes follow up, POC A1C.   Kerby Nora, MD

## 2023-02-06 NOTE — Assessment & Plan Note (Signed)
Stable, chronic.  Continue current medication.  evo 88 mcg daily.

## 2023-02-06 NOTE — Assessment & Plan Note (Signed)
Stable, chronic.  Continue current medication.  Amlodipine 10 mg daily

## 2023-02-06 NOTE — Assessment & Plan Note (Signed)
+  Recetn worsening with trip to [place] for months... will get back to regular lifestyle and re-eval in 3 months.  Will hold off on new DM diagnosis unless persistently elevated.  Encouraged exercise, weight loss, healthy eating habits.

## 2023-02-12 LAB — CYTOLOGY - PAP
Comment: NEGATIVE
Comment: NEGATIVE
Comment: NEGATIVE
Diagnosis: UNDETERMINED — AB
HPV 16: NEGATIVE
HPV 18 / 45: NEGATIVE
High risk HPV: POSITIVE — AB

## 2023-02-28 ENCOUNTER — Ambulatory Visit
Admission: RE | Admit: 2023-02-28 | Discharge: 2023-02-28 | Disposition: A | Discharge status: Home / Self Care | Payer: BC Managed Care – PPO | Source: Ambulatory Visit | Attending: Family Medicine | Admitting: Family Medicine

## 2023-02-28 DIAGNOSIS — Z1231 Encounter for screening mammogram for malignant neoplasm of breast: Secondary | ICD-10-CM

## 2023-02-28 DIAGNOSIS — Z85850 Personal history of malignant neoplasm of thyroid: Secondary | ICD-10-CM | POA: Diagnosis not present

## 2023-02-28 DIAGNOSIS — E890 Postprocedural hypothyroidism: Secondary | ICD-10-CM | POA: Diagnosis not present

## 2023-03-07 DIAGNOSIS — R6889 Other general symptoms and signs: Secondary | ICD-10-CM | POA: Diagnosis not present

## 2023-03-07 DIAGNOSIS — Z85850 Personal history of malignant neoplasm of thyroid: Secondary | ICD-10-CM | POA: Diagnosis not present

## 2023-03-07 DIAGNOSIS — E890 Postprocedural hypothyroidism: Secondary | ICD-10-CM | POA: Diagnosis not present

## 2023-05-01 DIAGNOSIS — Z85850 Personal history of malignant neoplasm of thyroid: Secondary | ICD-10-CM | POA: Diagnosis not present

## 2023-05-01 DIAGNOSIS — E890 Postprocedural hypothyroidism: Secondary | ICD-10-CM | POA: Diagnosis not present

## 2023-07-08 DIAGNOSIS — E890 Postprocedural hypothyroidism: Secondary | ICD-10-CM | POA: Diagnosis not present

## 2023-08-28 DIAGNOSIS — E890 Postprocedural hypothyroidism: Secondary | ICD-10-CM | POA: Diagnosis not present

## 2023-12-16 ENCOUNTER — Encounter: Payer: Self-pay | Admitting: Family Medicine

## 2023-12-16 ENCOUNTER — Ambulatory Visit (INDEPENDENT_AMBULATORY_CARE_PROVIDER_SITE_OTHER): Admitting: Family Medicine

## 2023-12-16 VITALS — BP 110/70 | HR 83 | Temp 97.7°F | Ht 62.0 in | Wt 167.1 lb

## 2023-12-16 DIAGNOSIS — R300 Dysuria: Secondary | ICD-10-CM | POA: Diagnosis not present

## 2023-12-16 DIAGNOSIS — N898 Other specified noninflammatory disorders of vagina: Secondary | ICD-10-CM

## 2023-12-16 LAB — POC URINALSYSI DIPSTICK (AUTOMATED)
Bilirubin, UA: NEGATIVE
Glucose, UA: NEGATIVE
Ketones, UA: NEGATIVE
Leukocytes, UA: NEGATIVE
Nitrite, UA: NEGATIVE
Protein, UA: POSITIVE — AB
Spec Grav, UA: 1.02 (ref 1.010–1.025)
Urobilinogen, UA: 0.2 U/dL
pH, UA: 6 (ref 5.0–8.0)

## 2023-12-16 MED ORDER — FLUCONAZOLE 150 MG PO TABS: 150.0000 mg | ORAL_TABLET | Freq: Once | ORAL | 0 refills | Status: AC

## 2023-12-16 NOTE — Progress Notes (Signed)
 Patient ID: [name], female    DOB: [birth year], 53 y.o.   MRN: [id number]  This visit was conducted in person.  BP 110/70   Pulse 83   Temp 97.7 F (36.5 C) (Temporal)   Ht 5' 2 (1.575 m)   Wt 167 lb 2 oz (75.8 kg)   LMP 12/06/2014 (LMP Unknown)   SpO2 99%   BMI 30.57 kg/m    CC:  Chief Complaint  Patient presents with   Dysuria   Vaginal Itching    Subjective:   HPI: [name] is a 53 y.o. female presenting on 12/16/2023 for Dysuria and Vaginal Itching     Occurred after recent trip to [place]   Days ago noted fishy odor and vaginal itching.  Minimal discharge.  No dysria. Some increase in frequency and urgency.  No fever.   Right lower side, achy off and on.   No new partner, she is married.   Used doxycycline  100 mg once daily .SABRA For malaria prevention.  Relevant past medical, surgical, family and social history reviewed and updated as indicated. Interim medical history since our last visit reviewed. Allergies and medications reviewed and updated. Outpatient Medications Prior to Visit  Medication Sig Dispense Refill   amLODipine  (NORVASC ) 10 MG tablet Take 1 tablet (10 mg total) by mouth daily. 90 tablet 3   levothyroxine  (SYNTHROID ) 88 MCG tablet Take 88 mcg by mouth every morning.     No facility-administered medications prior to visit.     Per HPI unless specifically indicated in ROS section below Review of Systems  Constitutional:  Negative for fatigue and fever.  HENT:  Negative for ear pain.   Eyes:  Negative for pain.  Respiratory:  Negative for chest tightness and shortness of breath.   Cardiovascular:  Negative for chest pain, palpitations and leg swelling.  Gastrointestinal:  Negative for abdominal pain.  Genitourinary:  Positive for frequency and urgency. Negative for dysuria.   Objective:  BP 110/70   Pulse 83   Temp 97.7 F (36.5 C) (Temporal)   Ht 5' 2 (1.575 m)   Wt 167 lb 2 oz (75.8 kg)   LMP 12/06/2014 (LMP Unknown)    SpO2 99%   BMI 30.57 kg/m   Wt Readings from Last 3 Encounters:  12/16/23 167 lb 2 oz (75.8 kg)  02/06/23 179 lb 2 oz (81.3 kg)  07/10/22 176 lb (79.8 kg)      Physical Exam Exam conducted with a chaperone present.  Constitutional:      General: She is not in acute distress.    Appearance: Normal appearance. She is well-developed. She is not ill-appearing or toxic-appearing.  HENT:     Head: Normocephalic.     Right Ear: Hearing, tympanic membrane, ear canal and external ear normal. Tympanic membrane is not erythematous, retracted or bulging.     Left Ear: Hearing, tympanic membrane, ear canal and external ear normal. Tympanic membrane is not erythematous, retracted or bulging.     Nose: No mucosal edema or rhinorrhea.     Right Sinus: No maxillary sinus tenderness or frontal sinus tenderness.     Left Sinus: No maxillary sinus tenderness or frontal sinus tenderness.     Mouth/Throat:     Pharynx: Uvula midline.  Eyes:     General: Lids are normal. Lids are everted, no foreign bodies appreciated.     Conjunctiva/sclera: Conjunctivae normal.     Pupils: Pupils are equal, round, and reactive to light.  Neck:     Thyroid : No thyroid  mass or thyromegaly.     Vascular: No carotid bruit.     Trachea: Trachea normal.  Cardiovascular:     Rate and Rhythm: Normal rate and regular rhythm.     Pulses: Normal pulses.     Heart sounds: Normal heart sounds, S1 normal and S2 normal. No murmur heard.    No friction rub. No gallop.  Pulmonary:     Effort: Pulmonary effort is normal. No tachypnea or respiratory distress.     Breath sounds: Normal breath sounds. No decreased breath sounds, wheezing, rhonchi or rales.  Abdominal:     General: Bowel sounds are normal.     Palpations: Abdomen is soft.     Tenderness: There is no abdominal tenderness.  Genitourinary:    Vagina: Erythema present. No vaginal discharge or tenderness.  Musculoskeletal:     Cervical back: Normal range of motion  and neck supple.  Skin:    General: Skin is warm and dry.     Findings: No rash.  Neurological:     Mental Status: She is alert.  Psychiatric:        Mood and Affect: Mood is not anxious or depressed.        Speech: Speech normal.        Behavior: Behavior normal. Behavior is cooperative.        Thought Content: Thought content normal.        Judgment: Judgment normal.       Results for orders placed or performed in visit on 12/16/23  POCT Urinalysis Dipstick (Automated)   Collection Time: 12/16/23  3:11 PM  Result Value Ref Range   Color, UA Yellow    Clarity, UA Clear    Glucose, UA Negative Negative   Bilirubin, UA Negative    Ketones, UA Negative    Spec Grav, UA 1.020 1.010 - 1.025   Blood, UA Small (1+)    pH, UA 6.0 5.0 - 8.0   Protein, UA Positive (A) Negative   Urobilinogen, UA 0.2 0.2 or 1.0 E.U./dL   Nitrite, UA Negative    Leukocytes, UA Negative Negative    Assessment and Plan  Dysuria Assessment & Plan: Symptoms not clearly consistent with urinary tract infection but urinalysis did show blood.  Will send urine for culture.  Orders: -     POCT Urinalysis Dipstick (Automated) -     Urine Culture  Vaginal itching Assessment & Plan: Acute, most likely vaginal Candida following doxycycline  prevention for of the low area.  Will treat with fluconazole 150 mg p.o. x 1 empirically but will send send out wet prep for identification.  Orders: -     WET PREP BY MOLECULAR PROBE  Other orders -     Fluconazole; Take 1 tablet (150 mg total) by mouth once for 1 dose.  Dispense: 1 tablet; Refill: 0    Return for annual physical with fasting labs prior.   Greig Ring, MD

## 2023-12-16 NOTE — Assessment & Plan Note (Signed)
 Acute, most likely vaginal Candida following doxycycline  prevention for of the low area.  Will treat with fluconazole 150 mg p.o. x 1 empirically but will send send out wet prep for identification.

## 2023-12-16 NOTE — Assessment & Plan Note (Signed)
 Symptoms not clearly consistent with urinary tract infection but urinalysis did show blood.  Will send urine for culture.

## 2023-12-17 ENCOUNTER — Ambulatory Visit: Payer: Self-pay | Admitting: Family Medicine

## 2023-12-17 LAB — URINE CULTURE
MICRO NUMBER:: 17036066
Result:: NO GROWTH
SPECIMEN QUALITY:: ADEQUATE

## 2023-12-17 LAB — WET PREP BY MOLECULAR PROBE
Candida species: NOT DETECTED
Gardnerella vaginalis: NOT DETECTED
MICRO NUMBER:: 17036065
SPECIMEN QUALITY:: ADEQUATE
Trichomonas vaginosis: NOT DETECTED

## 2023-12-17 MED ORDER — FLUCONAZOLE 150 MG PO TABS: 150.0000 mg | ORAL_TABLET | Freq: Once | ORAL | 0 refills | Status: AC

## 2024-02-10 ENCOUNTER — Encounter: Admitting: Family Medicine

## 2024-02-18 DIAGNOSIS — Z85850 Personal history of malignant neoplasm of thyroid: Secondary | ICD-10-CM | POA: Diagnosis not present

## 2024-02-18 DIAGNOSIS — E890 Postprocedural hypothyroidism: Secondary | ICD-10-CM | POA: Diagnosis not present

## 2024-02-19 ENCOUNTER — Encounter: Payer: Self-pay | Admitting: Family Medicine

## 2024-02-19 ENCOUNTER — Other Ambulatory Visit (HOSPITAL_COMMUNITY)
Admission: RE | Admit: 2024-02-19 | Discharge: 2024-02-19 | Disposition: A | Source: Ambulatory Visit | Attending: Family Medicine | Admitting: Family Medicine

## 2024-02-19 ENCOUNTER — Ambulatory Visit: Admitting: Family Medicine

## 2024-02-19 VITALS — BP 110/80 | HR 77 | Temp 98.7°F | Ht 61.75 in | Wt 168.0 lb

## 2024-02-19 DIAGNOSIS — R7303 Prediabetes: Secondary | ICD-10-CM | POA: Diagnosis not present

## 2024-02-19 DIAGNOSIS — E890 Postprocedural hypothyroidism: Secondary | ICD-10-CM | POA: Diagnosis not present

## 2024-02-19 DIAGNOSIS — R87810 Cervical high risk human papillomavirus (HPV) DNA test positive: Secondary | ICD-10-CM

## 2024-02-19 DIAGNOSIS — Z0000 Encounter for general adult medical examination without abnormal findings: Secondary | ICD-10-CM | POA: Diagnosis not present

## 2024-02-19 DIAGNOSIS — D509 Iron deficiency anemia, unspecified: Secondary | ICD-10-CM

## 2024-02-19 DIAGNOSIS — R87610 Atypical squamous cells of undetermined significance on cytologic smear of cervix (ASC-US): Secondary | ICD-10-CM

## 2024-02-19 DIAGNOSIS — I10 Essential (primary) hypertension: Secondary | ICD-10-CM

## 2024-02-19 MED ORDER — AMLODIPINE BESYLATE 10 MG PO TABS: 5.0000 mg | ORAL_TABLET | Freq: Every day | ORAL | 3 refills | Status: AC

## 2024-02-19 NOTE — Assessment & Plan Note (Signed)
Due for recheck.  Was due to menorrhagia in past.

## 2024-02-19 NOTE — Assessment & Plan Note (Signed)
 Stable, chronic.  Continue current medication.  Due for re-eval.  levo 88 mcg daily.

## 2024-02-19 NOTE — Assessment & Plan Note (Addendum)
 Due for re-eval... possible diabetes diagnosis from elevated A1c last year but had been eating atypically in [place] last year. Now back to heart healthy diet. Lab Results  Component Value Date   HGBA1C 6.7 (H) 01/30/2023

## 2024-02-19 NOTE — Progress Notes (Signed)
 Patient ID: [name], female    DOB: Apr 29, 1970, 53 y.o.   MRN: [id number]  This visit was conducted in person.  BP 110/80   Pulse 77   Temp 98.7 F (37.1 C) (Temporal)   Ht 5' 1.75 (1.568 m)   Wt 168 lb (76.2 kg)   LMP 12/06/2014 (LMP Unknown)   SpO2 99%   BMI 30.98 kg/m    CC:  Chief Complaint  Patient presents with   Annual Exam    Subjective:   HPI: [name] is a 53 y.o. female presenting on 02/19/2024 for Annual Exam  The patient presents for complete physical and review of chronic health problems. He/She also has the following acute concerns today: none   Last CPX: 2024  No GYN.  Hypertension:   Well controlled on amlodipine  10 mg daily... using 4 days a week. BP Readings from Last 3 Encounters:  02/19/24 110/80  12/16/23 110/70  02/06/23 94/60  Using medication without problems or lightheadedness:  when taking amlodipine  daily. Chest pain with exertion: none Edema:none Short of breath: none Average home BPs: 110/72 Other issues:  Wt Readings from Last 3 Encounters:  02/19/24 168 lb (76.2 kg)  12/16/23 167 lb 2 oz (75.8 kg)  02/06/23 179 lb 2 oz (81.3 kg)    Post ablative hypothyroidism:  levothyroxine  88 mcg  Hx of papillary adenocarcinoma of thyroid  Dx 2022 s/p  total thyroidectomy, sees endocrinologist.  Prediabetes ... Due for re-eval. Lab Results  Component Value Date   HGBA1C 6.7 (H) 01/30/2023     Wt Readings from Last 3 Encounters:  02/19/24 168 lb (76.2 kg)  12/16/23 167 lb 2 oz (75.8 kg)  02/06/23 179 lb 2 oz (81.3 kg)   Body mass index is 30.98 kg/m.      Relevant past medical, surgical, family and social history reviewed and updated as indicated. Interim medical history since our last visit reviewed. Allergies and medications reviewed and updated. Outpatient Medications Prior to Visit  Medication Sig Dispense Refill   levothyroxine  (SYNTHROID ) 75 MCG tablet Take 75 mcg by mouth every morning.     amLODipine   (NORVASC ) 10 MG tablet Take 1 tablet (10 mg total) by mouth daily. 90 tablet 3   levothyroxine  (SYNTHROID ) 88 MCG tablet Take 88 mcg by mouth every morning.     No facility-administered medications prior to visit.     Per HPI unless specifically indicated in ROS section below Review of Systems  Constitutional:  Negative for fatigue and fever.  HENT:  Negative for congestion.   Eyes:  Negative for pain.  Respiratory:  Negative for cough and shortness of breath.   Cardiovascular:  Negative for chest pain, palpitations and leg swelling.  Gastrointestinal:  Negative for abdominal pain.  Genitourinary:  Negative for dysuria and vaginal bleeding.  Musculoskeletal:  Negative for back pain.  Neurological:  Negative for syncope, light-headedness and headaches.  Psychiatric/Behavioral:  Negative for dysphoric mood.    Objective:  BP 110/80   Pulse 77   Temp 98.7 F (37.1 C) (Temporal)   Ht 5' 1.75 (1.568 m)   Wt 168 lb (76.2 kg)   LMP 12/06/2014 (LMP Unknown)   SpO2 99%   BMI 30.98 kg/m   Wt Readings from Last 3 Encounters:  02/19/24 168 lb (76.2 kg)  12/16/23 167 lb 2 oz (75.8 kg)  02/06/23 179 lb 2 oz (81.3 kg)      Physical Exam Exam conducted with a chaperone present.  Constitutional:  General: She is not in acute distress.    Appearance: Normal appearance. She is well-developed. She is not ill-appearing or toxic-appearing.  HENT:     Head: Normocephalic.     Right Ear: Hearing, tympanic membrane, ear canal and external ear normal. Tympanic membrane is not erythematous, retracted or bulging.     Left Ear: Hearing, tympanic membrane, ear canal and external ear normal. Tympanic membrane is not erythematous, retracted or bulging.     Nose: Nose normal. No mucosal edema or rhinorrhea.     Right Sinus: No maxillary sinus tenderness or frontal sinus tenderness.     Left Sinus: No maxillary sinus tenderness or frontal sinus tenderness.     Mouth/Throat:     Pharynx: Uvula  midline.  Eyes:     General: Lids are normal. Lids are everted, no foreign bodies appreciated.     Conjunctiva/sclera: Conjunctivae normal.     Pupils: Pupils are equal, round, and reactive to light.  Neck:     Thyroid : No thyroid  mass or thyromegaly.     Vascular: No carotid bruit.     Trachea: Trachea normal.  Cardiovascular:     Rate and Rhythm: Normal rate and regular rhythm.     Pulses: Normal pulses.     Heart sounds: Normal heart sounds, S1 normal and S2 normal. No murmur heard.    No friction rub. No gallop.  Pulmonary:     Effort: Pulmonary effort is normal. No tachypnea or respiratory distress.     Breath sounds: Normal breath sounds. No decreased breath sounds, wheezing, rhonchi or rales.  Abdominal:     General: Bowel sounds are normal. There is no distension or abdominal bruit.     Palpations: Abdomen is soft. There is no fluid wave or mass.     Tenderness: There is no abdominal tenderness. There is no guarding or rebound.     Hernia: No hernia is present.  Genitourinary:    Exam position: Lithotomy position.     Labia:        Right: No rash, tenderness or lesion.        Left: No rash, tenderness or lesion.      Vagina: Normal.     Cervix: No cervical motion tenderness, discharge or friability.     Uterus: Not enlarged and not tender.      Adnexa:        Right: No mass, tenderness or fullness.         Left: No mass, tenderness or fullness.    Musculoskeletal:     Cervical back: Normal range of motion and neck supple.  Lymphadenopathy:     Cervical: No cervical adenopathy.  Skin:    General: Skin is warm and dry.     Findings: No rash.  Neurological:     Mental Status: She is alert.     Cranial Nerves: No cranial nerve deficit.     Sensory: No sensory deficit.  Psychiatric:        Mood and Affect: Mood is not anxious or depressed.        Speech: Speech normal.        Behavior: Behavior normal. Behavior is cooperative.        Thought Content: Thought content  normal.        Judgment: Judgment normal.       Results for orders placed or performed in visit on 12/16/23  POCT Urinalysis Dipstick (Automated)   Collection Time: 12/16/23  3:11 PM  Result Value Ref Range   Color, UA Yellow    Clarity, UA Clear    Glucose, UA Negative Negative   Bilirubin, UA Negative    Ketones, UA Negative    Spec Grav, UA 1.020 1.010 - 1.025   Blood, UA Small (1+)    pH, UA 6.0 5.0 - 8.0   Protein, UA Positive (A) Negative   Urobilinogen, UA 0.2 0.2 or 1.0 E.U./dL   Nitrite, UA Negative    Leukocytes, UA Negative Negative  Urine Culture   Collection Time: 12/16/23  3:44 PM   Specimen: Urine  Result Value Ref Range   MICRO NUMBER: [id number]    SPECIMEN QUALITY: Adequate    Sample Source URINE    STATUS: FINAL    Result: No Growth   WET PREP BY MOLECULAR PROBE   Collection Time: 12/16/23  3:44 PM   Specimen: Urine  Result Value Ref Range   MICRO NUMBER: [id number]    SPECIMEN QUALITY: Adequate    SOURCE: NOT SPECIFIED    STATUS: FINAL    Trichomonas vaginosis Not Detected    Gardnerella vaginalis Not Detected    Candida species Not Detected     Assessment and Plan The patient's preventative maintenance and recommended screening tests for an annual wellness exam were reviewed in full today. Brought up to date unless services declined.  Counselled on the importance of diet, exercise, and its role in overall health and mortality. The patient's FH and SH was reviewed, including their home life, tobacco status, and drug and alcohol status.   Vaccines:  Consider PCV20, flu.. refused Up-to-date with Shingrix 2024, COVID-vaccine x 2, tetanus 2019 Pap/DVE: 01/2023 positive HPV, ASCUS.SABRA due for yearly PAP Mammo:  Plan when returns from trip Bone Density: Not indicated Colon: March 02, 2019 recommended every 7 years Smoking Status: None ETOH/ drug use: None/none  Hep C: Done  HIV screen: Done  Routine general medical examination at a health care  facility  Essential hypertension, benign Assessment & Plan: Stable, chronic.  Having some low BP and lightheadedness on 10 mg daily.    Decrease to Amlodipine  5 mg daily.  Orders: -     Comprehensive metabolic panel with GFR -     Lipid panel  Prediabetes Assessment & Plan: Due for re-eval... possible diabetes diagnosis from elevated A1c last year but had been eating atypically in [place] last year. Now back to heart healthy diet. Lab Results  Component Value Date   HGBA1C 6.7 (H) 01/30/2023      Orders: -     Hemoglobin A1c  Postoperative hypothyroidism Assessment & Plan: Stable, chronic.  Continue current medication.  Due for re-eval.  levo 88 mcg daily.  Orders: -     TSH -     T3, free -     T4, free  Iron deficiency anemia, unspecified iron deficiency anemia type Assessment & Plan:   Due for recheck.  Was due to menorrhagia in past.  Orders: -     CBC with Differential/Platelet  ASCUS with positive high risk HPV cervical -     Cytology - PAP  Other orders -     amLODIPine  Besylate; Take 0.5 tablets (5 mg total) by mouth daily.  Dispense: 45 tablet; Refill: 3     Return in about 1 year (around 02/18/2025) for annual physical with fasting labs prior.   Greig Ring, MD

## 2024-02-19 NOTE — Assessment & Plan Note (Addendum)
 Stable, chronic.  Having some low BP and lightheadedness on 10 mg daily.    Decrease to Amlodipine  5 mg daily.

## 2024-02-20 ENCOUNTER — Ambulatory Visit: Payer: Self-pay | Admitting: Family Medicine

## 2024-02-20 DIAGNOSIS — E890 Postprocedural hypothyroidism: Secondary | ICD-10-CM | POA: Diagnosis not present

## 2024-02-20 DIAGNOSIS — Z85850 Personal history of malignant neoplasm of thyroid: Secondary | ICD-10-CM | POA: Diagnosis not present

## 2024-02-20 LAB — COMPREHENSIVE METABOLIC PANEL WITH GFR
ALT: 15 U/L (ref 0–35)
AST: 15 U/L (ref 0–37)
Albumin: 4.4 g/dL (ref 3.5–5.2)
Alkaline Phosphatase: 69 U/L (ref 39–117)
BUN: 15 mg/dL (ref 6–23)
CO2: 26 meq/L (ref 19–32)
Calcium: 9.2 mg/dL (ref 8.4–10.5)
Chloride: 104 meq/L (ref 96–112)
Creatinine, Ser: 0.91 mg/dL (ref 0.40–1.20)
GFR: 70.83 mL/min (ref >60.00)
Glucose, Bld: 92 mg/dL (ref 70–99)
Potassium: 3.7 meq/L (ref 3.5–5.1)
Sodium: 140 meq/L (ref 135–145)
Total Bilirubin: 0.3 mg/dL (ref 0.2–1.2)
Total Protein: 7.2 g/dL (ref 6.0–8.3)

## 2024-02-20 LAB — CBC WITH DIFFERENTIAL/PLATELET
Basophils Absolute: 0 K/uL (ref 0.0–0.1)
Basophils Relative: 0.7 % (ref 0.0–3.0)
Eosinophils Absolute: 0.1 K/uL (ref 0.0–0.7)
Eosinophils Relative: 2.7 % (ref 0.0–5.0)
HCT: 34.6 % — ABNORMAL LOW (ref 36.0–46.0)
Hemoglobin: 11.5 g/dL — ABNORMAL LOW (ref 12.0–15.0)
Lymphocytes Relative: 49.1 % — ABNORMAL HIGH (ref 12.0–46.0)
Lymphs Abs: 2.6 K/uL (ref 0.7–4.0)
MCHC: 33.3 g/dL (ref 30.0–36.0)
MCV: 86.1 fl (ref 78.0–100.0)
Monocytes Absolute: 0.4 K/uL (ref 0.1–1.0)
Monocytes Relative: 7.1 % (ref 3.0–12.0)
Neutro Abs: 2.1 K/uL (ref 1.4–7.7)
Neutrophils Relative %: 40.4 % — ABNORMAL LOW (ref 43.0–77.0)
Platelets: 219 K/uL (ref 150.0–400.0)
RBC: 4.02 Mil/uL (ref 3.87–5.11)
RDW: 15.3 % (ref 11.5–15.5)
WBC: 5.3 K/uL (ref 4.0–10.5)

## 2024-02-20 LAB — LIPID PANEL
Cholesterol: 137 mg/dL (ref 0–200)
HDL: 47.7 mg/dL (ref >39.00)
LDL Cholesterol: 72 mg/dL (ref 0–99)
NonHDL: 88.82
Total CHOL/HDL Ratio: 3
Triglycerides: 82 mg/dL (ref 0.0–149.0)
VLDL: 16.4 mg/dL (ref 0.0–40.0)

## 2024-02-20 LAB — HEMOGLOBIN A1C: Hgb A1c MFr Bld: 6.1 % (ref 4.6–6.5)

## 2024-02-20 LAB — TSH: TSH: 2.54 u[IU]/mL (ref 0.35–5.50)

## 2024-02-20 LAB — T4, FREE: Free T4: 0.94 ng/dL (ref 0.60–1.60)

## 2024-02-20 LAB — T3, FREE: T3, Free: 2.9 pg/mL (ref 2.3–4.2)

## 2024-03-02 LAB — CYTOLOGY - PAP
Comment: NEGATIVE
Comment: NEGATIVE
Comment: NEGATIVE
Diagnosis: NEGATIVE
HPV 16: NEGATIVE
HPV 18 / 45: NEGATIVE
High risk HPV: POSITIVE — AB

## 2024-03-12 ENCOUNTER — Other Ambulatory Visit: Payer: Self-pay | Admitting: Family Medicine

## 2024-03-12 DIAGNOSIS — Z1231 Encounter for screening mammogram for malignant neoplasm of breast: Secondary | ICD-10-CM

## 2024-03-23 ENCOUNTER — Ambulatory Visit
Admission: RE | Admit: 2024-03-23 | Discharge: 2024-03-23 | Disposition: A | Source: Ambulatory Visit | Attending: Family Medicine | Admitting: Family Medicine

## 2024-03-23 DIAGNOSIS — Z1231 Encounter for screening mammogram for malignant neoplasm of breast: Secondary | ICD-10-CM

## 2024-03-25 ENCOUNTER — Ambulatory Visit: Payer: Self-pay | Admitting: Family Medicine
